# Patient Record
Sex: Female | Born: 1975 | Race: White | Hispanic: No | Marital: Married | Smoking: Former smoker
Health system: Southern US, Community
[De-identification: ages and names within clinical notes are randomized; demographics above are authoritative.]

## PROBLEM LIST (undated history)

## (undated) DIAGNOSIS — F419 Anxiety disorder, unspecified: Secondary | ICD-10-CM

## (undated) DIAGNOSIS — O24419 Gestational diabetes mellitus in pregnancy, unspecified control: Secondary | ICD-10-CM

## (undated) DIAGNOSIS — G43909 Migraine, unspecified, not intractable, without status migrainosus: Secondary | ICD-10-CM

## (undated) DIAGNOSIS — F1910 Other psychoactive substance abuse, uncomplicated: Secondary | ICD-10-CM

## (undated) DIAGNOSIS — L509 Urticaria, unspecified: Secondary | ICD-10-CM

## (undated) DIAGNOSIS — D649 Anemia, unspecified: Secondary | ICD-10-CM

## (undated) HISTORY — DX: Migraine, unspecified, not intractable, without status migrainosus: G43.909

## (undated) HISTORY — DX: Gestational diabetes mellitus in pregnancy, unspecified control: O24.419

## (undated) HISTORY — PX: COLONOSCOPY: SHX174

## (undated) HISTORY — PX: OTHER SURGICAL HISTORY: SHX169

## (undated) HISTORY — DX: Anemia, unspecified: D64.9

## (undated) HISTORY — DX: Other psychoactive substance abuse, uncomplicated: F19.10

## (undated) HISTORY — DX: Anxiety disorder, unspecified: F41.9

## (undated) HISTORY — DX: Urticaria, unspecified: L50.9

## (undated) HISTORY — PX: TONSILLECTOMY: SUR1361

---

## 2005-07-14 ENCOUNTER — Inpatient Hospital Stay (HOSPITAL_COMMUNITY): Admission: AD | Admit: 2005-07-14 | Discharge: 2005-07-17 | Payer: Self-pay | Admitting: Obstetrics and Gynecology

## 2005-07-14 ENCOUNTER — Encounter (INDEPENDENT_AMBULATORY_CARE_PROVIDER_SITE_OTHER): Payer: Self-pay | Admitting: *Deleted

## 2007-03-25 ENCOUNTER — Encounter: Admission: RE | Admit: 2007-03-25 | Discharge: 2007-03-25 | Payer: Self-pay | Admitting: Obstetrics & Gynecology

## 2007-04-03 ENCOUNTER — Ambulatory Visit (HOSPITAL_COMMUNITY): Admission: RE | Admit: 2007-04-03 | Discharge: 2007-04-03 | Payer: Self-pay | Admitting: Obstetrics & Gynecology

## 2007-05-29 ENCOUNTER — Inpatient Hospital Stay (HOSPITAL_COMMUNITY): Admission: AD | Admit: 2007-05-29 | Discharge: 2007-06-01 | Payer: Self-pay | Admitting: Obstetrics and Gynecology

## 2008-10-07 ENCOUNTER — Encounter: Payer: Self-pay | Admitting: Internal Medicine

## 2009-02-06 IMAGING — US US OB COMP +14 WK
1 series · 14 of 28 positions shown · non-contrast
Comparison: none

OBSTETRICAL ULTRASOUND:

 This ultrasound exam was performed in the [HOSPITAL] Ultrasound Department.  The OB US report was generated in the AS system, and faxed to the ordering physician.  This report is also available in [REDACTED] PACS.

[Series 1: us ob comp +14 wk · 0.26mm/px · 14 of 44 slices shown]
[im 2/44]
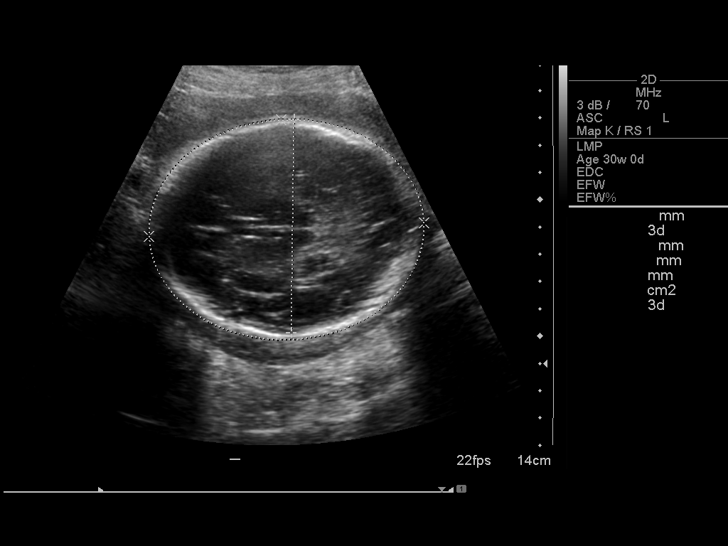
[im 5/44]
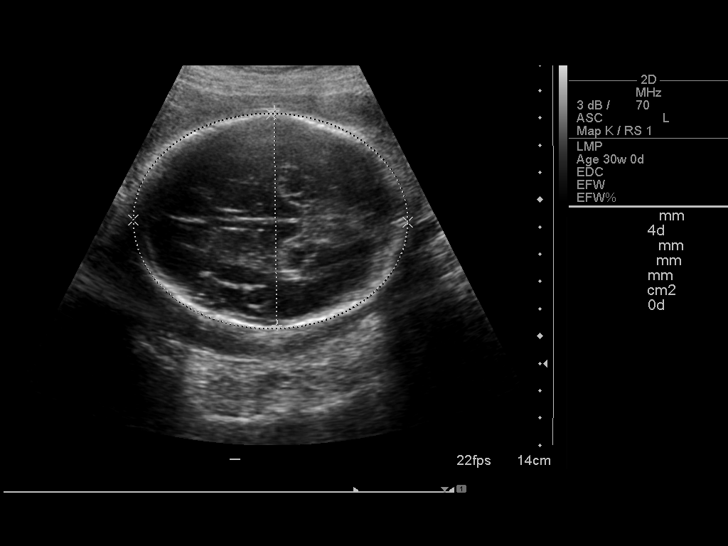
[im 8/44]
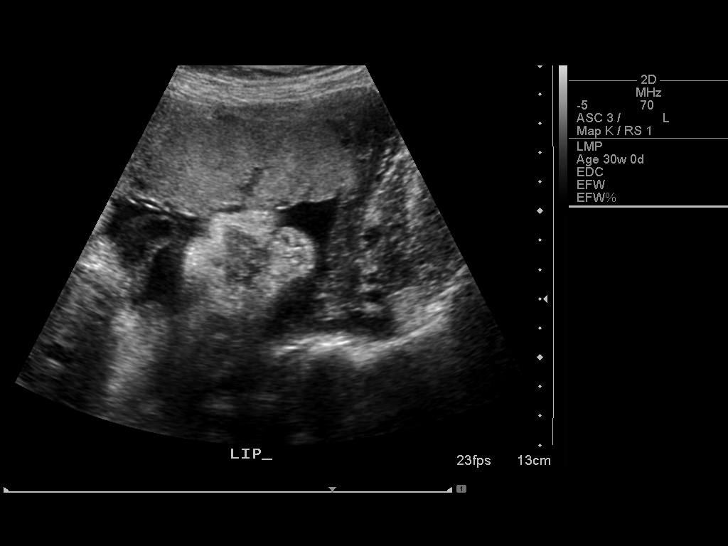
[im 12/44]
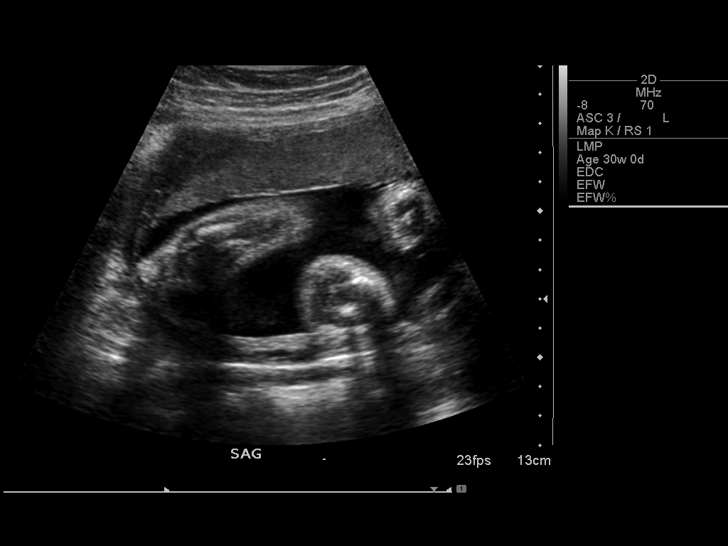
[im 15/44]
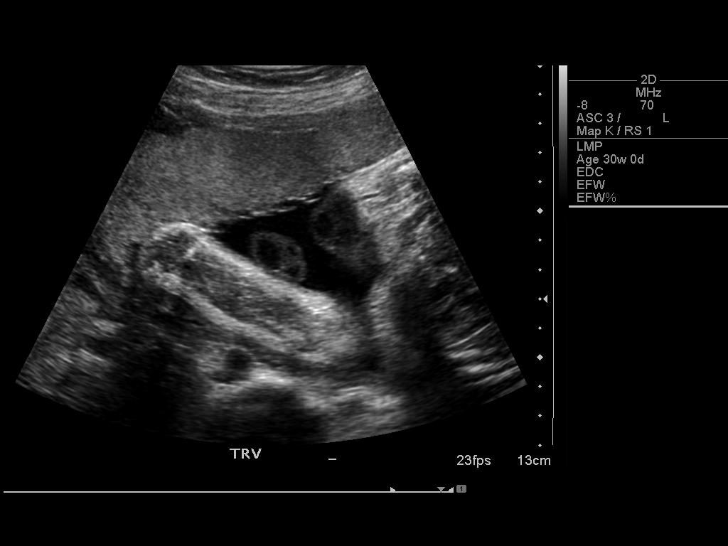
[im 18/44]
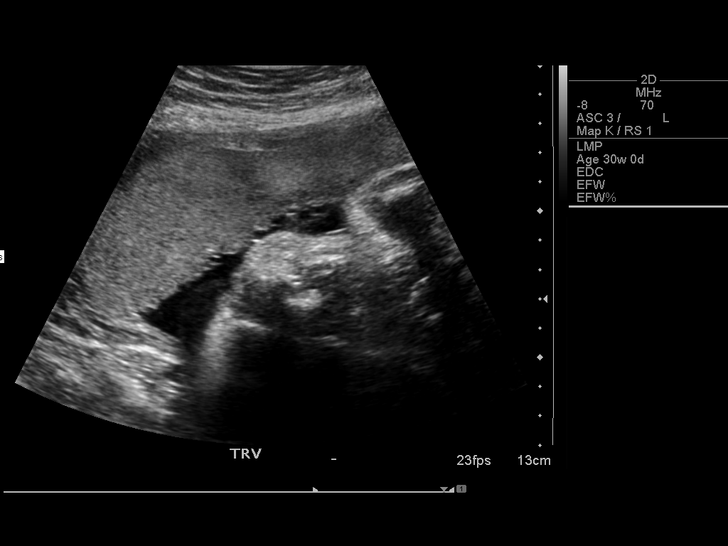
[im 21/44]
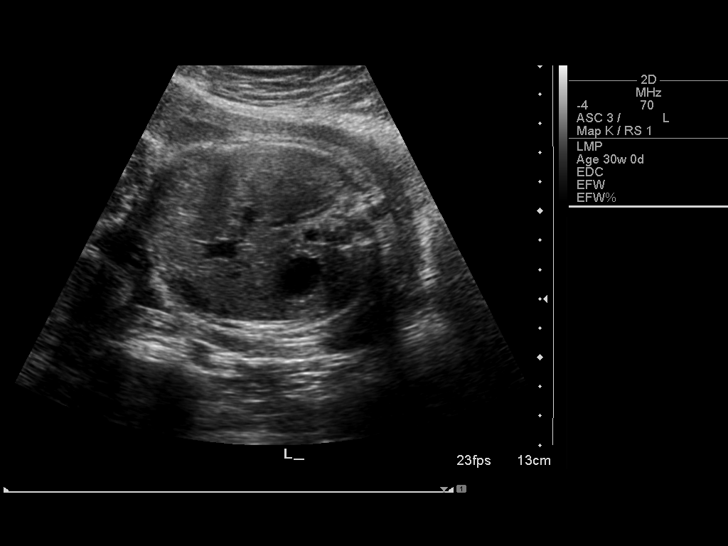
[im 24/44]
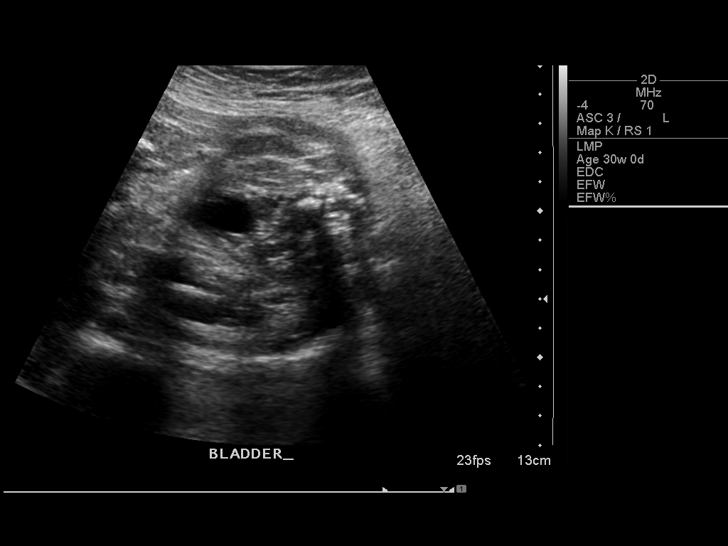
[im 28/44]
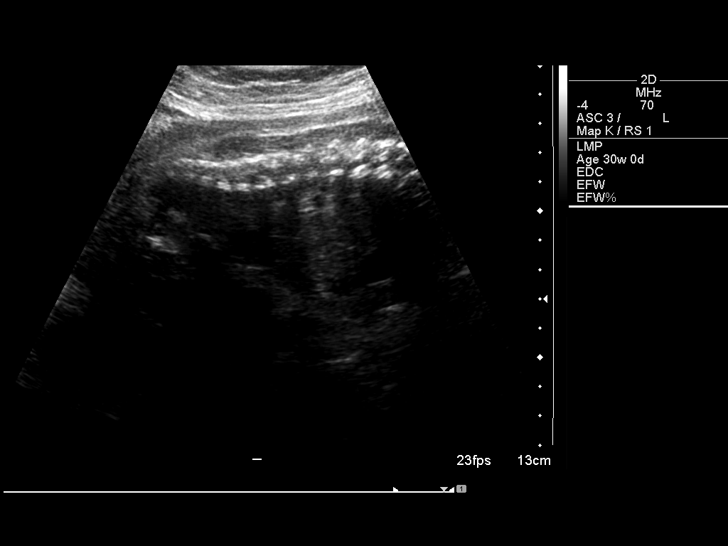
[im 31/44]
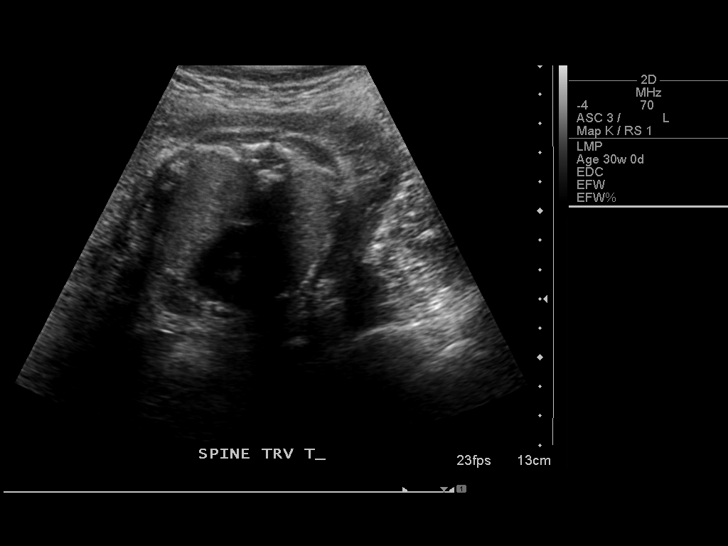
[im 34/44]
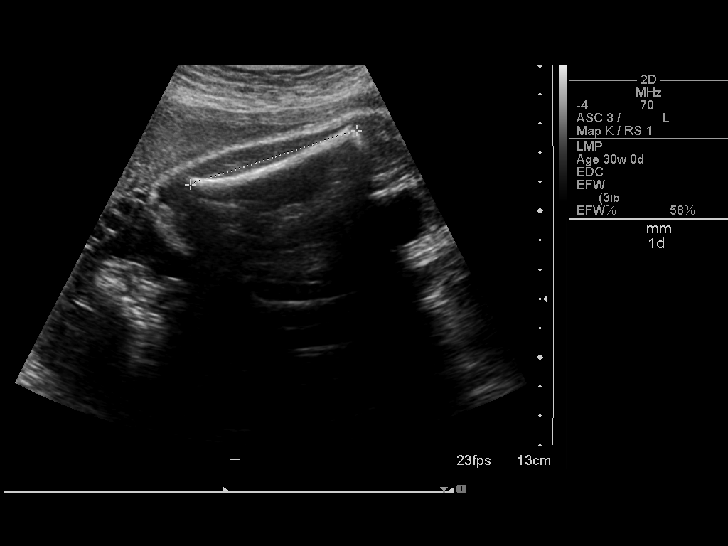
[im 37/44]
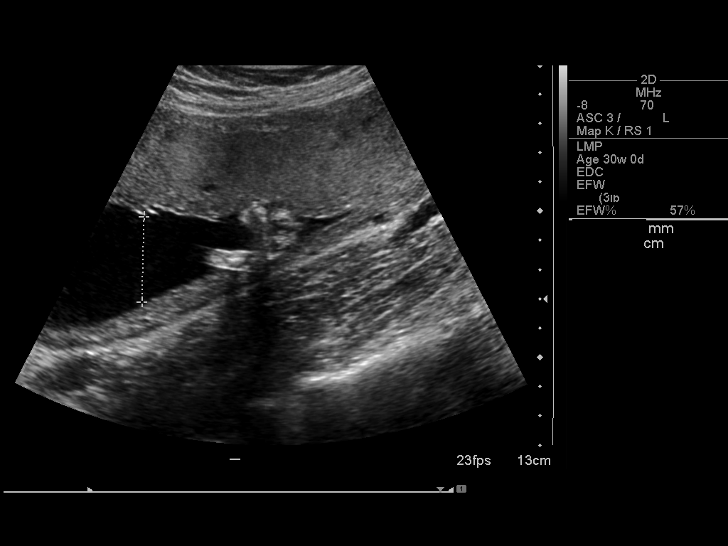
[im 40/44]
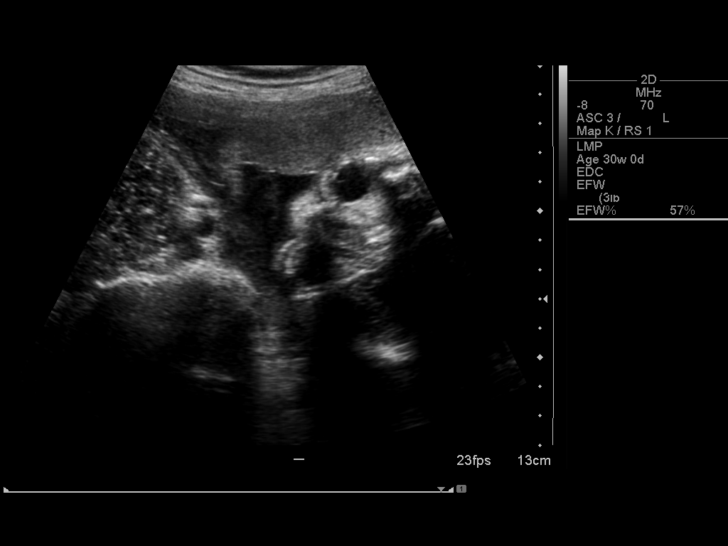
[im 44/44]
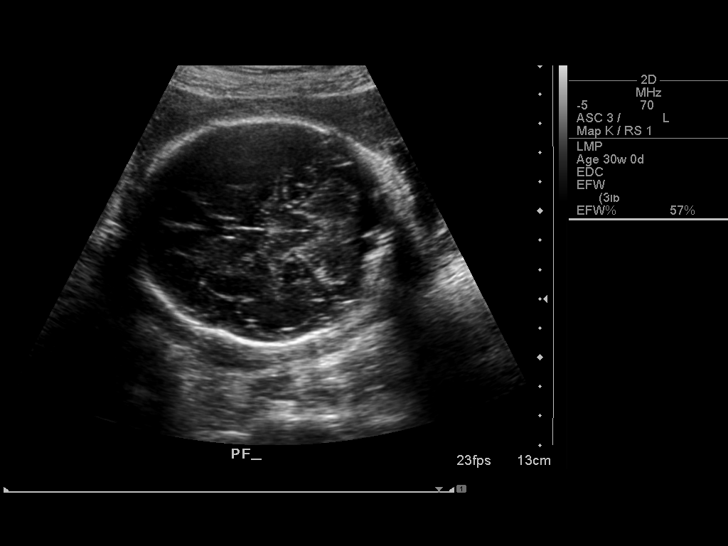

[14 of 28 positions shown; findings below may reference images not displayed]

IMPRESSION: See AS Obstetric US report.

## 2009-04-27 ENCOUNTER — Inpatient Hospital Stay (HOSPITAL_COMMUNITY): Admission: AD | Admit: 2009-04-27 | Discharge: 2009-04-29 | Payer: Self-pay | Admitting: Obstetrics

## 2009-11-21 ENCOUNTER — Encounter: Payer: Self-pay | Admitting: Internal Medicine

## 2010-05-08 ENCOUNTER — Ambulatory Visit: Payer: Self-pay | Admitting: Internal Medicine

## 2010-05-08 ENCOUNTER — Encounter: Payer: Self-pay | Admitting: Internal Medicine

## 2010-05-08 ENCOUNTER — Ambulatory Visit (HOSPITAL_COMMUNITY): Admission: RE | Admit: 2010-05-08 | Discharge: 2010-05-08 | Payer: Self-pay | Source: Home / Self Care

## 2010-05-08 ENCOUNTER — Ambulatory Visit: Payer: Self-pay

## 2010-05-08 ENCOUNTER — Ambulatory Visit: Payer: Self-pay | Admitting: Cardiology

## 2010-05-08 ENCOUNTER — Encounter (INDEPENDENT_AMBULATORY_CARE_PROVIDER_SITE_OTHER): Payer: Self-pay | Admitting: *Deleted

## 2010-05-08 DIAGNOSIS — G43909 Migraine, unspecified, not intractable, without status migrainosus: Secondary | ICD-10-CM | POA: Insufficient documentation

## 2010-05-08 DIAGNOSIS — I499 Cardiac arrhythmia, unspecified: Secondary | ICD-10-CM | POA: Insufficient documentation

## 2010-05-08 DIAGNOSIS — R599 Enlarged lymph nodes, unspecified: Secondary | ICD-10-CM | POA: Insufficient documentation

## 2010-05-08 DIAGNOSIS — Z8632 Personal history of gestational diabetes: Secondary | ICD-10-CM | POA: Insufficient documentation

## 2010-05-08 DIAGNOSIS — D649 Anemia, unspecified: Secondary | ICD-10-CM

## 2010-05-09 DIAGNOSIS — R0789 Other chest pain: Secondary | ICD-10-CM

## 2010-05-10 ENCOUNTER — Encounter: Payer: Self-pay | Admitting: Cardiology

## 2010-05-10 ENCOUNTER — Ambulatory Visit: Payer: Self-pay | Admitting: Cardiology

## 2010-05-10 DIAGNOSIS — R9431 Abnormal electrocardiogram [ECG] [EKG]: Secondary | ICD-10-CM | POA: Insufficient documentation

## 2010-05-10 DIAGNOSIS — R002 Palpitations: Secondary | ICD-10-CM | POA: Insufficient documentation

## 2010-05-16 ENCOUNTER — Encounter: Payer: Self-pay | Admitting: Internal Medicine

## 2010-06-09 ENCOUNTER — Encounter: Payer: Self-pay | Admitting: Internal Medicine

## 2010-06-09 LAB — CONVERTED CEMR LAB
BUN: 15 mg/dL (ref 6–23)
CO2: 26 meq/L (ref 19–32)
Cholesterol: 179 mg/dL (ref 0–200)
Creatinine, Ser: 0.76 mg/dL (ref 0.40–1.20)
Glucose, Bld: 93 mg/dL (ref 70–99)
HCT: 36.1 % (ref 36.0–46.0)
HDL: 74 mg/dL (ref >39)
MCV: 86.8 fL (ref 78.0–100.0)
RDW: 12.4 % (ref 11.5–15.5)
Sodium: 143 meq/L (ref 135–145)
TSH: 2.179 microintl units/mL (ref 0.350–4.500)
WBC: 4.4 10*3/uL (ref 4.0–10.5)

## 2010-06-13 ENCOUNTER — Ambulatory Visit
Admission: RE | Admit: 2010-06-13 | Discharge: 2010-06-13 | Payer: Self-pay | Source: Home / Self Care | Attending: Internal Medicine | Admitting: Internal Medicine

## 2010-06-13 DIAGNOSIS — M546 Pain in thoracic spine: Secondary | ICD-10-CM | POA: Insufficient documentation

## 2010-06-26 ENCOUNTER — Ambulatory Visit
Admission: RE | Admit: 2010-06-26 | Discharge: 2010-06-26 | Payer: Self-pay | Source: Home / Self Care | Attending: Psychiatry | Admitting: Psychiatry

## 2010-07-11 NOTE — Assessment & Plan Note (Signed)
Summary: [facility]   Visit Type:  Initial Consult Primary Provider:  Dondra Spry DO  CC:  follow up echo.  History of Present Illness: 35 year old female for evaluation of abnormal ECG and palpitations. No prior cardiac history. Patient seen recently for routine physical examination and PR interval felt to be short. Echocardiogram in November of 2011 revealed normal LV function and no significant valvular abnormality. There was concern about possible WPW based on short PR interval and cardiology was asked to further evaluate. The patient denies dyspnea on exertion, orthopnea, PND, pedal edema, history of syncope or chest pain. She rarely feels a brief "skip" but no sustained palpitations.  Current Medications (verified): 1)  Paragard Intrauterine Copper  Iud (Iud's) .... Placed in January 2010 2)  Multivitamins  Tabs (Multiple Vitamin) .... Sometimes  Allergies: 1)  ! Erythromycin  Past History:  Past Medical History: ANEMIA MIGRAINE HEADACHE DIABETES MELLITUS, GESTATIONAL She has a history of one uncomplicated TAB in 1995.   Past Surgical History: Primary uncomplicated C. section in 2007 Tonsillectomy  Family History: Reviewed history from 05/08/2010 and no changes required. Kidney stones, rheumatoid arthritis, skin cancer, nicotine dependence, hypertension.  No premature CAD in immediate family     Social History: Reviewed history from 05/08/2010 and no changes required. She is a nonsmoker (previous 10 pack year history).  Nondrinker.  She denies domestic or physical violence.  Married  3 Children  Review of Systems       no fevers or chills, productive cough, hemoptysis, dysphasia, odynophagia, melena, hematochezia, dysuria, hematuria, rash, seizure activity, orthopnea, PND, pedal edema, claudication. Remaining systems are negative.   Vital Signs:  Patient profile:   35 year old female Height:      65 inches Weight:      125.75 pounds BMI:      21.00 Pulse rate:   88 / minute Pulse rhythm:   regular Resp:     18 per minute BP sitting:   130 / 80  (left arm) Cuff size:   large  Vitals Entered By: Vikki Ports (May 10, 2010 11:09 AM)  Physical Exam  General:  Well developed/well nourished in NAD Skin warm/dry Patient not depressed No peripheral clubbing Back-normal HEENT-normal/normal eyelids Neck supple/normal carotid upstroke bilaterally; no bruits; no JVD; no thyromegaly chest - CTA/ normal expansion CV - RRR/normal S1 and S2; no murmurs, rubs or gallops;  PMI nondisplaced Abdomen -NT/ND, no HSM, no mass, + bowel sounds, no bruit 2+ femoral pulses, no bruits Ext-no edema, chords, 2+ DP Neuro-grossly nonfocal     Impression & Recommendations:  Problem # 1:  ABNORMAL ELECTROCARDIOGRAM (ICD-794.31) I reviewed the patient's electrocardiogram. The PR interval is 0.12. I think this is at the lower limits of normal. There is no delta wave. She does not have sustained palpitations or history of syncope. I therefore do not think she has WPW at this point. I do not think further workup or therapy is indicated.  Problem # 2:  PALPITATIONS (ICD-785.1) These sound to be most likely premature beats. They are self-limited and did not appear to be problematic. No further workup necessary.

## 2010-07-11 NOTE — Miscellaneous (Signed)
Summary: pap smear, tdap, flu  Clinical Lists Changes  Observations: Added new observation of TD BOOSTER: Historical (06/13/2009 13:31) Added new observation of FLU VAX: Historical (03/19/2009 13:31) Added new observation of PAP SMEAR: normal (10/07/2008 13:33)      Immunization History:  Tetanus/Td Immunization History:    Tetanus/Td:  historical (06/13/2009)  Influenza Immunization History:    Influenza:  historical (03/19/2009)    Preventive Care Screening  Last Tetanus Booster:    Date:  06/13/2009    Results:  Historical  Pap Smear:    Date:  10/07/2008    Results:  normal

## 2010-07-11 NOTE — Letter (Signed)
Summary: Outpatient Coinsurance Notice  Outpatient Coinsurance Notice   Imported By: Marylou Mccoy 05/10/2010 11:59:03  _____________________________________________________________________  External Attachment:    Type:   Image     Comment:   External Document

## 2010-07-11 NOTE — Miscellaneous (Signed)
Summary: Appointment Canceled  Appointment status changed to canceled by [employer] on 05/08/2010 11:01 AM.  Cancellation Comments --------------------- echo/short pr interv. poss WPW  Appointment Information ----------------------- Appt Type:  CARDIOLOGY ANCILLARY VISIT      Date:  Wednesday, May 10, 2010      Time:  2:00 PM for 60 min   Urgency:  Routine   Made By:  Hoy Finlay Scheduler  To Visit:  [id number]    Reason:  echo/short pr interv. poss WPW  Appt Comments ------------- -- 05/08/10 11:01: ([facility]) CANCELED -- echo/short pr interv. poss WPW -- 05/08/10 10:58: ([facility]) BOOKED -- Routine CARDIOLOGY ANCILLARY VISIT at 05/10/2010 2:00 PM for 60 min echo/short pr interv. poss WPW

## 2010-07-11 NOTE — Assessment & Plan Note (Signed)
Summary: NEW TO BE EST [employer] WANTS CPX/MHF   Vital Signs:  Patient profile:   35 year old female Menstrual status:  regular LMP:     04/13/2010 Height:      65 inches Weight:      124.25 pounds BMI:     20.75 O2 Sat:      100 % on Room air Temp:     98.8 degrees F oral Pulse rate:   99 / minute Resp:     18 per minute BP sitting:   110 / 60  (right arm) Cuff size:   regular  Vitals Entered By: Glendell Docker CMA (May 08, 2010 9:44 AM)  O2 Flow:  Room air CC: New Patient Is Patient Diabetic? No Pain Assessment Patient in pain? no      LMP (date): 04/13/2010     Menstrual Status regular Enter LMP: 04/13/2010 Last PAP Result normal   Primary Care Provider:  Dondra Spry DO  CC:  New Patient.  History of Present Illness: 35 y/o white female to establish previously seen by GYN for primary care  no significant medical hx besides migraines  2nd pregnancy - one episode of migraine with aura  3 headaches over last 6 months seen at headache center - Dr. Neale Burly gets nausea, light sensitivity  Preventive Screening-Counseling & Management  Alcohol-Tobacco     Alcohol drinks/day: 1     Smoking Status: quit     Packs/Day: 1.0     Year Started: 1994     Year Quit: 2004  Caffeine-Diet-Exercise     Caffeine use/day: 3 beverages daily     Does Patient Exercise: no  Allergies (verified): 1)  ! Erythromycin  Past History:  Past Medical History: Gestational diabetes with second pregnancy Migraine headaches - Dr. 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  The patient denies fever, weight loss, weight gain, chest pain, dyspnea on exertion, abdominal pain, and severe indigestion/heartburn.         no hx of syncope occ fluttering of chest pt also reports small lymph node right lower neck  Physical Exam  General:  alert, well-developed, and well-nourished.   Head:  normocephalic and atraumatic.   Eyes:  pupils equal, pupils round, and pupils reactive to light.   Ears:  R ear normal and L ear normal.   Mouth:  good dentition and pharynx pink and moist.   Neck:  supple, no masses, and no carotid bruits.    Lungs:  normal respiratory effort, normal breath sounds, no crackles, and no wheezes.   Heart:  normal rate, regular rhythm, and no gallop.   Abdomen:  soft, non-tender, normal bowel sounds, no hepatomegaly, and no splenomegaly.   Extremities:  No lower extremity edema  Neurologic:  cranial nerves II-XII intact.   Skin:  turgor normal and color normal.   Cervical Nodes:  tiny right lower neck LN (base of SCM muscle) Psych:  normally interactive, good eye contact, not anxious appearing, and not depressed appearing.     Impression &  Recommendations:  Problem # 1:  HEALTH MAINTENANCE EXAM (ICD-V70.0) Reviewed adult health maintenance protocols.  Orders: T-Basic Metabolic Panel [id number]) T-CBC No Diff ([id number]) T-Lipid Profile ([id number]) T-TSH [id number])  Pap smear: normal (09/28/2008) Td Booster: Tdap [facility]) (03/27/2005)     Problem # 2:  DIABETES MELLITUS, GESTATIONAL (ICD-648.80)  Orders: T- Hemoglobin A1C ([id number])  Problem # 3:  MIGRAINE HEADACHE (ICD-346.90) followed by Dr. Neale Burly no MRI of brain completed I agree MRI may not be necessary unless headache freq or character changes she was prescribed reglan to use when she experienced aura but never started due to apprehension about side effects I suggest 600 mg of ibuprofen with  first sign of headache  Problem # 4:  CARDIAC ARRHYTHMIA (ICD-427.9) pt with occ fluttering in her chest.   EKG shows shortened PR.  consider WPW obtain 2 D Echo arrange cardiology eval Orders: Echo Referral (Echo) Cardiology Referral (Cardiology)  Problem # 5:  CERVICAL LYMPHADENOPATHY (ICD-785.6) pt with tiny right lower cervical LN.  observe for now pt advised to reports any change in size  Complete Medication List: 1)  Paragard Intrauterine Copper Iud (Iud's) .... Placed in january 2010 2)  Multivitamins Tabs (Multiple vitamin) .... Sometimes  Patient Instructions: 1)  Please schedule a follow-up appointment in 3 months   Orders Added: 1)  T-Basic Metabolic Panel [[id number]] 2)  T-CBC No Diff [[id number]] 3)  T-Lipid Profile [[id number]] 4)  T-TSH [[id number]] 5)  T- Hemoglobin A1C [83036-23375] 6)  Echo Referral [Echo] 7)  Cardiology Referral [Cardiology] 8)  New Patient 18-39 years [99385] 81)  New Patient Level II [99202]   Immunization History:  Tetanus/Td Immunization History:    Tetanus/Td:  tdap (state) (03/27/2005)   Immunization History:  Tetanus/Td Immunization History:    Tetanus/Td:  Tdap ([place]) (03/27/2005)    Preventive Care Screening  Pap Smear:    Date:  09/28/2008    Results:  normal   Last Tetanus Booster:    Date:  03/27/2005    Results:  Tdap [facility])    Current Allergies (reviewed today): ! ERYTHROMYCIN

## 2010-07-13 NOTE — Letter (Signed)
[facility]   Imported By: Maryln Gottron 06/23/2010 11:09:37  _____________________________________________________________________  External Attachment:    Type:   Image     Comment:   External Document

## 2010-07-13 NOTE — Assessment & Plan Note (Signed)
Summary: back pain/mhf   Vital Signs:  Patient profile:   35 year old female Menstrual status:  regular Height:      65 inches Weight:      129.25 pounds BMI:     21.59 O2 Sat:      100 % on Room air Temp:     98.1 degrees F oral Pulse rate:   96 / minute Resp:     18 per minute BP sitting:   124 / 60  (right arm) Cuff size:   regular  Vitals Entered By: Glendell Docker CMA (June 13, 2010 10:06 AM)  O2 Flow:  Room air CC: Back Pain, Back pain Is Patient Diabetic? No Pain Assessment Patient in pain? yes     Location: upper back Intensity: 3 Type: aching/burning Onset of pain  Intermittent Comments discuss oily hair and skin, discuss echo cardio, review of labs,  mild mid to upper back pain for the past  month, location varies   Primary Care Provider:  DThomos Lemons DO  CC:  Back Pain and Back pain.  History of Present Illness:  Back Pain      This is a 35 year old woman who presents with Back pain.  The patient denies fever, chills, and weakness.  The pain is located in the right thoracic back and left thoracic back.  The pain began at home and gradually.  The pain is made better by inactivity.  pt freq lifing / carrying her children  reviewed cardiology consult  pt admits she may have chronic anxiety issue   Preventive Screening-Counseling & Management  Alcohol-Tobacco     Smoking Status: quit  Allergies: 1)  ! Erythromycin  Past History:  Past Medical History: ANEMIA MIGRAINE HEADACHE DIABETES MELLITUS, GESTATIONAL She has a history of one uncomplicated TAB in 1995.  Anxiety  Past Surgical History: Primary uncomplicated C. section in 2007 Tonsillectomy   Family History: Kidney stones, rheumatoid arthritis, skin cancer, nicotine dependence, hypertension.  No premature CAD in immediate family      Social History: She is a nonsmoker (previous 10 pack year history).  Nondrinker.  She denies domestic or physical violence.  Married  3  Children    Physical Exam  General:  alert, well-developed, and well-nourished.   Lungs:  normal respiratory effort, normal breath sounds, no crackles, and no wheezes.   Heart:  normal rate, regular rhythm, and no gallop.   Extremities:  No lower extremity edema    Impression & Recommendations:  Problem # 1:  BACK PAIN, THORACIC REGION (ICD-724.1) likely strain from lifing use muscle relaxer as needed we discussed proper lifting techniques Patient advised to call office if symptoms persist or worsen.  Her updated medication list for this problem includes:    Cyclobenzaprine Hcl 5 Mg Tabs (Cyclobenzaprine hcl) ..... One by mouth at bedtime as needed for upper back pain  Complete Medication List: 1)  Paragard Intrauterine Copper Iud (Iud's) .... Placed in january 2010 2)  Multivitamins Tabs (Multiple vitamin) .... Take 1 tablet by mouth once a day 3)  Cyclobenzaprine Hcl 5 Mg Tabs (Cyclobenzaprine hcl) .... One by mouth at bedtime as needed for upper back pain  Patient Instructions: 1)  Please schedule a follow-up appointment in 1 year. 2)  Please schedule a follow-up appointment as needed. Prescriptions: CYCLOBENZAPRINE HCL 5 MG TABS (CYCLOBENZAPRINE HCL) one by mouth at bedtime as needed for upper back pain  #30 x 1   Entered and Authorized by:  Dondra Spry DO   Signed by:   D. Thomos Lemons DO on 06/13/2010   Method used:   Electronically to        [facility]. [phone]* (retail)       [address]       [place], [place]  [address]       Ph: [phone]       Fax: [phone]   RxID:   [id number]    Orders Added: 1)  Est. Patient Level III [36644]    Current Allergies (reviewed today): ! ERYTHROMYCIN

## 2010-07-17 ENCOUNTER — Ambulatory Visit (INDEPENDENT_AMBULATORY_CARE_PROVIDER_SITE_OTHER): Payer: 59 | Admitting: Psychiatry

## 2010-07-17 DIAGNOSIS — F4323 Adjustment disorder with mixed anxiety and depressed mood: Secondary | ICD-10-CM

## 2010-09-13 LAB — CBC
HCT: 36.3 % (ref 36.0–46.0)
Hemoglobin: 12.4 g/dL (ref 12.0–15.0)
MCV: 92.1 fL (ref 78.0–100.0)
Platelets: 213 10*3/uL (ref 150–400)
RDW: 12.6 % (ref 11.5–15.5)
WBC: 14 10*3/uL — ABNORMAL HIGH (ref 4.0–10.5)

## 2010-09-13 LAB — RPR: RPR Ser Ql: NONREACTIVE

## 2010-10-24 NOTE — Discharge Summary (Signed)
Mary Berg, Mary Berg                 ACCOUNT NO.:  [id number]   MEDICAL RECORD NO.:  [id number]          PATIENT TYPE:  INP   LOCATION:  9171                          [facility]ILITY:  [facility]   PHYSICIAN:  Lenoard Aden, M.D.DATE OF BIRTH:  December 30, 1975   DATE OF ADMISSION:  05/29/2007  DATE OF DISCHARGE:                               DISCHARGE SUMMARY   ADMISSION DIAGNOSIS:  A 38 weeks of oligohydramnios for cervical  ripening, induction and attempted vaginal birth after cesarean.   HISTORY:  She is a 35 year old Caucasian female G3, P1, with history of  abortion x 1, history of primary C. section in 2006 for breech, who  presents with oligohydramnios for attempted VBAC.  She has a personal  history of gestational diabetes, diet controlled.  She is RH negative.  Prenatal course otherwise uncomplicated except for gestational diabetes.  GBS is negative.   ALLERGIES:  ERYTHROMYCIN.   SOCIAL HISTORY:  She is a nonsmoker.  Nondrinker.  She denies domestic  or physical violence.   FAMILY HISTORY:  Kidney stones, rheumatoid arthritis, skin cancer,  nicotine dependence, hypertension.   PAST MEDICAL HISTORY:  1. She has a history of one uncomplicated TAB in 1995.  2. Primary uncomplicated C. section in 2007.   PHYSICAL EXAMINATION:  GENERAL:  She is a well-developed, well-nourished  white female in no acute distress.  HEENT:  Normal.  LUNGS:  Clear.  HEART:  Regular rhythm.  ABDOMEN:  Soft, gravid, nontender.  Estimated fetal weight by ultrasound  6 pounds 13 ounces.  GU:  Cervix is 1-2 cm, 50% vertex, -1, -2.  EXTREMITIES:  Show no cords.  NEUROLOGIC:  Intact.   NST reveals fetal heart tones to be 160-170 beats per minute range with  a good beat variability.  Cervidil to be placed upon obtaining an IV.   IMPRESSION:  1. A 36 week OB.  2. Gestational diabetes, diet controlled.  3. Oligohydramnios.  4. Previous Cesarean section with attempted vaginal birth after      cesarean.   Small risk of uterine dehiscence with Pitocin and/or prostaglandin use  are discussed in the 1-3% range.  The patient acknowledges and wishes to  proceed.      Lenoard Aden, M.D.  Electronically Signed     RJT/MEDQ  D:  05/29/2007  T:  05/29/2007  Job:  [id number]   cc:   Lenoard Aden, M.D.  Fax: [phone]

## 2010-10-27 NOTE — Op Note (Signed)
Mary Berg, Mary Berg                 ACCOUNT NO.:  [id number]   MEDICAL RECORD NO.:  [id number]          PATIENT TYPE:  INP   LOCATION:  9320                          [facility]ACILITY:  [facility]   PHYSICIAN:  Richardean Sale, M.D.   DATE OF BIRTH:  [birth year]   DATE OF PROCEDURE:  07/14/2005  DATE OF DISCHARGE:                                 OPERATIVE REPORT   PREOPERATIVE DIAGNOSES:  1.  A 38+ week intrauterine pregnancy.  2.  Complete breech presentation.  3.  Spontaneous rupture of membranes.   POSTOPERATIVE DIAGNOSES:  1.  A 38+ week intrauterine pregnancy.  2.  Complete breech presentation.  3.  Spontaneous rupture of membranes.   PROCEDURE:  Primary low transverse cesarean section.   SURGEON:  Dr. Richardean Sale   ASSISTANT:  None.   ANESTHESIA:  Spinal.   COMPLICATIONS:  None.   ESTIMATED BLOOD LOSS:  500 mL.   URINE OUTPUT:  600 mL, clear.   OPERATIVE FINDINGS:  Viable female infant in the complete breech presentation  with Apgars of 9 and 9, intact placenta with three-vessel cord, arterial  cord pH 7.31 estimated birth weight 8 pounds 4 ounces.  Normal-appearing  uterus, normal uterine cavity, normal-appearing fallopian tubes and ovaries  with a small collapsed cyst on the left ovary, suspicious for a corpus  luteum or possibly an endometrioma, total size less than 2 cm.   INDICATIONS:  This is a 35 year old gravida 2, para 0-0-1-0 white female,  who is at 38+ weeks' gestation, who presented on the morning of February 3.  2007. complaining of spontaneous rupture membranes at 4:30 a.m.  On  evaluation, the patient was found to be grossly ruptured and contracting  irregularly.  Fetal heart rate tracing was reassuring.  The patient was  known to have a breech presentation, and ultrasound was performed at the  bedside which confirmed the pregnancy was still breech.  The patient was  therefore consented on recommendations for primary cesarean delivery.  The  risks, benefits, and  alternatives of the procedure were reviewed with the  patient in detail.  We discussed the risks which include but are not limited  to hemorrhage requiring transfusion; infection; injury to the bowel, the  bladder, the ureters or other organs which could require additional surgery  at the time of this procedure or in the future.  Reviewed risks of  anesthesia and related complications.  The patient was understanding of all  these risks and desired to proceed.  Informed consent was obtained before  proceeding to the OR.   PROCEDURE:  The patient was taken to the operating room where she was given  a spinal anesthetic.  She was then prepped and draped in the usual sterile  fashion with Betadine, and a Foley catheter was placed.  She was placed in  the supine position with a leftward tilt.  Then 10 mL of 0.25% plain  Marcaine were then used to inject the skin where the incision was to be  made, and a Pfannenstiel skin incision was then made with a scalpel.  This  was  carried down sharply to the fascia.  The fascia was then incised in the  midline.  The superior and inferior aspects of the fascia were then grasped  with Kocher clamps, elevated, and the underlying rectus muscles dissected  off with both sharp and blunt dissection.  The muscles were then separated  in the midline.  The peritoneum was then identified, grasped between 2  hemostats, and entered sharply.  This incision was then extended superiorly  and inferiorly with good visualization of the bladder.  The bladder blade  was then inserted, and the vesicouterine peritoneum was grasped with pickups  and entered sharply with Metzenbaum scissors.  This incision was then  extended laterally, and the bladder flap was created digitally.   The bladder blade was then reinserted and the lower uterine segment was  incised in a transverse fashion with a scalpel. This incision was then  extended manually and was then inserted into the  incision.  The infant was  confirmed as complete breech.  The breech was delivered by rotating the  sacrum anteriorly.  The knees and hips were delivered in a flexed position.  The arms were delivered by sweeping them across the chest, and the head was  delivered in a flexed position with the finger in the mouth.  After  delivery, the nose and mouth were suctioned with the bulb.  The cord was  clamped and cut, and the infant was handed off to the waiting NICU  attendants with a vigorous cry.  Cord pH and cord blood were sent.  Apgars  were 9 and 9.  At this point, the placenta was then removed; the uterus was  cleared of all clot debris, and the uterine incision was repaired with 1-0  chromic in a running locked fashion.  The second layer of the same suture  was then placed in an imbricating fashion for additional hemostasis and in  the event the patient would trial vaginal delivery in the future.  Once the  uterine incision was hemostatic, the uterus was reinspected.  The adnexa  were inspected, and a small less than 2 cm collapsed cyst was present on the  left ovary.  There was a very firm, pea-sized area present on this cyst.  This was grasped with a Babcock clamp and removed with the Bovie cautery and  was sent to pathology labeled as left ovarian biopsy.  The ovary was  hemostatic.  The uterine incision was reinspected and was hemostatic.  The  pelvis was then irrigated copiously with warm normal saline.  Any areas of  bleeding were cauterized with the Bovie.  The peritoneal surfaces, muscular  surfaces, and subfascial areas were all inspected, and any areas of bleeding  were cauterized with the Bovie.  The peritoneum was then closed with a  running 3-0 Vicryl suture, and the fascia was closed with a running Vicryl  suture.  Subcutaneous space was irrigated.  Any areas of bleeding were  cauterized with a Bovie, and the skin was closed with staples.  The patient tolerated this  procedure very well.  All sponge, lap, needle,  and instruments were correct x2.  The patient was taken to recovery room  awake and in stable condition, and there were no complications.      Richardean Sale, M.D.  Electronically Signed     JW/MEDQ  D:  07/14/2005  T:  07/14/2005  Job:  [id number]

## 2010-10-27 NOTE — Discharge Summary (Signed)
NAMEARIDAY, BRINKER                 ACCOUNT NO.:  [id number]   MEDICAL RECORD NO.:  [id number]          PATIENT TYPE:  INP   LOCATION:  9320                          FACILITY:  [facility]   PHYSICIAN:  Richardean Sale, M.D.   DATE OF BIRTH:  16-Dec-1975   DATE OF ADMISSION:  07/14/2005  DATE OF DISCHARGE:  07/17/2005                                 DISCHARGE SUMMARY   ADMITTING DIAGNOSES:  A 38+ week intrauterine pregnancy with spontaneous  rupture of membranes and breech presentation.   DISCHARGE DIAGNOSES:  1.  A 38+ week intrauterine pregnancy with spontaneous rupture of membranes      and breech presentation.  2.  Status post primary cesarean delivery.  3.  Anemia.   PROCEDURES:  Primary low-transverse cesarean section performed on July 14, 2005.   OPERATIVE FINDINGS:  Viable female infant with Apgars of 9 and 9, estimated  birth weight of 8 pounds 3 ounces and arterial cord pH 7.31.   HOSPITAL COURSE AND HISTORY OF PRESENT ILLNESS:  Please see the admission  history and physical for details.  Briefly, the patient is 35 year old white  female, who is at 38+ weeks gestation with a known breech presentation, who  presented on the morning of July 14, 2005 with spontaneous rupture of  membranes.  The patient subsequently underwent an uncomplicated primary  cesarean delivery resulting in delivery of a viable female infant.  Her  postoperative course was unremarkable.  The patient remained afebrile  throughout her hospitalization.  On postoperative day #3, she was ambulating  well, was voiding without difficulty, bowel function had returned and she  was tolerating a regular diet, and her pain was adequately controlled.  She  was subsequently discharged to home on postoperative day #3 in good  condition.   DISPOSITION:  To home.   CONDITION:  Good.   FOLLOW UP:  The patient will follow up in 4-6 weeks for a postoperative  visit at [facility].   INSTRUCTIONS:  She is to call  for fever greater than 100.4, any abdominal  pain not relieved with pain medication, heavy vaginal bleeding, redness or  drainage from her incision, or any other questions.   LABORATORY STUDIES:  Preoperative hemoglobin 11.2, hematocrit 33.1,  platelets 248 and white count 7.9.  Postoperative hemoglobin 8.6, hematocrit  25.3, platelet count 204 and white count 9.8.   ADDENDUM:  At the time of the patient's cesarean delivery, there was a small  fragment of tissue that suggested of endometriosis.  This was biopsied and  the final pathology report came back as luteinized stroma with associated  hemorrhage.  No malignancy or endometriosis identified.      Richardean Sale, M.D.  Electronically Signed     JW/MEDQ  D:  08/05/2005  T:  08/06/2005  Job:  [id number]

## 2011-01-18 ENCOUNTER — Encounter: Payer: Self-pay | Admitting: Cardiology

## 2011-03-16 LAB — CBC
HCT: 35.5 — ABNORMAL LOW
Hemoglobin: 12.4
MCV: 90.4
RBC: 3.93
RDW: 12.9

## 2011-06-20 ENCOUNTER — Ambulatory Visit (INDEPENDENT_AMBULATORY_CARE_PROVIDER_SITE_OTHER): Payer: 59 | Admitting: Internal Medicine

## 2011-06-20 ENCOUNTER — Encounter: Payer: Self-pay | Admitting: Internal Medicine

## 2011-06-20 DIAGNOSIS — N949 Unspecified condition associated with female genital organs and menstrual cycle: Secondary | ICD-10-CM

## 2011-06-20 DIAGNOSIS — G8929 Other chronic pain: Secondary | ICD-10-CM | POA: Insufficient documentation

## 2011-06-20 DIAGNOSIS — R109 Unspecified abdominal pain: Secondary | ICD-10-CM

## 2011-06-20 DIAGNOSIS — R102 Pelvic and perineal pain: Secondary | ICD-10-CM

## 2011-06-20 LAB — POCT URINALYSIS DIPSTICK
Glucose, UA: NEGATIVE
Ketones, UA: NEGATIVE
Leukocytes, UA: NEGATIVE
Nitrite, UA: NEGATIVE
Protein, UA: NEGATIVE
Spec Grav, UA: 1.015

## 2011-06-20 NOTE — Progress Notes (Signed)
  Subjective:    Patient ID: Mary Berg, female    DOB: 22-Oct-1975, 36 y.o.   MRN: [id number]  HPI  36 year old white female complains of chronic intermittent left lower quadrant/left pelvic pain. She first noticed her symptoms during her third pregnancy. She describes left-sided dull ache. Her symptoms occasionally localized to the left flank. Her symptoms are not related to her menstrual cycles or urination. In she reports her symptoms somethimes relieved by passing gas. During her second and third trimester of her third pregnancy her symptoms completely went away until approximately 6 months ago. She was seen by her GYN in April 2012. There is no mention of abnormal pelvic exam.  Review of Systems Negative for change in bowel habits, no dysuria, no urinary frequency.  Negative for melena or hematochezia  Past Medical History  Diagnosis Date  . Anemia   . Migraine headache   . DM (diabetes mellitus), gestational   . Anxiety     History   Social History  . Marital Status: Married    Spouse Name: N/A    Number of Children: 3  . Years of Education: N/A   Occupational History  . Not on file.   Social History Main Topics  . Smoking status: Former Smoker -- 1.0 packs/day for 10 years    Types: Cigarettes  . Smokeless tobacco: Not on file  . Alcohol Use: No  . Drug Use: Not on file  . Sexually Active: Not on file   Other Topics Concern  . Not on file   Social History Narrative  . No narrative on file    Past Surgical History  Procedure Date  . Tab     history of one uncomplicated TAB in 1995  . Cesarean section     primary uncomplicated in 2007  . Tonsillectomy     Family History  Problem Relation Age of Onset  . Nephrolithiasis    . Arthritis    . Skin cancer    . Hypertension    . Coronary artery disease      no premature in immediate family    Allergies  Allergen Reactions  . Erythromycin     REACTION: sensitive stomach    Current Outpatient  Prescriptions on File Prior to Visit  Medication Sig Dispense Refill  . Multiple Vitamin (MULTIVITAMIN) tablet Take 1 tablet by mouth daily.          BP 102/68  Temp(Src) 99.6 F (37.6 C) (Oral)  Wt 131 lb (59.421 kg)      Objective:   Physical Exam  Constitutional: She is oriented to person, place, and time. She appears well-developed and well-nourished.  Cardiovascular: Normal rate, regular rhythm and normal heart sounds.   Pulmonary/Chest: Effort normal and breath sounds normal. No respiratory distress. She has no wheezes.  Abdominal: Soft. Bowel sounds are normal.       Mild left pelvic fullness  Genitourinary: Guaiac negative stool.  Neurological: She is alert and oriented to person, place, and time.  Skin: Skin is warm and dry.  Psychiatric: She has a normal mood and affect. Her behavior is normal.          Assessment & Plan:

## 2011-06-20 NOTE — Patient Instructions (Signed)
Our office will contact you re:  Ultrasound results You can try taking over the counter Gas x or Beano

## 2011-06-20 NOTE — Assessment & Plan Note (Signed)
A 36 year old white female with intermittent left pelvic/left lower quadrant pain. I suspect her symptoms are IBS in nature. Her rectal exam was normal. Obtain pelvic ultrasound considering mild fullness of left pelvis.  Patient reports eating high fiber diet.   Patient will try over the counter gas x or beano. Patient advised to call office if symptoms persist or worsen.

## 2011-06-21 ENCOUNTER — Other Ambulatory Visit: Payer: Self-pay | Admitting: Internal Medicine

## 2011-06-27 ENCOUNTER — Other Ambulatory Visit: Payer: Self-pay | Admitting: Internal Medicine

## 2011-06-27 ENCOUNTER — Ambulatory Visit
Admission: RE | Admit: 2011-06-27 | Discharge: 2011-06-27 | Disposition: A | Discharge status: Home / Self Care | Payer: 59 | Source: Ambulatory Visit | Attending: Internal Medicine | Admitting: Internal Medicine

## 2011-06-27 DIAGNOSIS — R102 Pelvic and perineal pain: Secondary | ICD-10-CM

## 2011-06-28 ENCOUNTER — Telehealth: Payer: Self-pay | Admitting: Internal Medicine

## 2011-06-28 NOTE — Telephone Encounter (Signed)
Pt would like abd ultrasound

## 2011-06-29 NOTE — Telephone Encounter (Signed)
Pt aware of results 

## 2011-07-18 ENCOUNTER — Encounter: Payer: Self-pay | Admitting: Internal Medicine

## 2011-07-18 ENCOUNTER — Ambulatory Visit (INDEPENDENT_AMBULATORY_CARE_PROVIDER_SITE_OTHER): Payer: 59 | Admitting: Internal Medicine

## 2011-07-18 VITALS — BP 102/70 | Temp 99.0°F | Wt 132.0 lb

## 2011-07-18 DIAGNOSIS — G8929 Other chronic pain: Secondary | ICD-10-CM

## 2011-07-18 DIAGNOSIS — R1032 Left lower quadrant pain: Secondary | ICD-10-CM

## 2011-07-18 NOTE — Assessment & Plan Note (Signed)
36 year old white female with chronic left lower quadrant abdominal pain. Transvaginal ultrasound was negative for ovarian abnormality. I do not think her uterine fibroid is related to her symptomatology. Considering her sense of occasional incomplete evacuation, refer to GI for further evaluation.

## 2011-07-18 NOTE — Progress Notes (Signed)
  Subjective:    Patient ID: Mary Berg, female    DOB: 1976-03-10, 36 y.o.   MRN: [id number]  HPI  36 year old white female for followup regarding intermittent left lower quadrant pain. I reviewed her pelvic ultrasound. It was negative for ovarian abnormalities but did show a 2.1 cm uterine fibroid. Patient reports that her symptoms do improve when she decreases her intake of fibrous foods. However patient has noticed that she has sensation of incomplete evacuation and has bowel movements more frequently. She denies any family history of colon cancer.   Review of Systems Negative for melanoma or hematochezia Past Medical History  Diagnosis Date  . Anemia   . Migraine headache   . DM (diabetes mellitus), gestational   . Anxiety     History   Social History  . Marital Status: Married    Spouse Name: N/A    Number of Children: 3  . Years of Education: N/A   Occupational History  . Not on file.   Social History Main Topics  . Smoking status: Former Smoker -- 1.0 packs/day for 10 years    Types: Cigarettes  . Smokeless tobacco: Not on file  . Alcohol Use: No  . Drug Use: Not on file  . Sexually Active: Not on file   Other Topics Concern  . Not on file   Social History Narrative  . No narrative on file    Past Surgical History  Procedure Date  . Tab     history of one uncomplicated TAB in 1995  . Cesarean section     primary uncomplicated in 2007  . Tonsillectomy     Family History  Problem Relation Age of Onset  . Nephrolithiasis    . Arthritis    . Skin cancer    . Hypertension    . Coronary artery disease      no premature in immediate family    Allergies  Allergen Reactions  . Erythromycin     REACTION: sensitive stomach    No current outpatient prescriptions on file prior to visit.    BP 102/70  Temp(Src) 99 F (37.2 C) (Oral)  Wt 132 lb (59.875 kg)       Objective:   Physical Exam  Constitutional: She appears well-developed and  well-nourished.  Cardiovascular: Normal rate, regular rhythm and normal heart sounds.   Pulmonary/Chest: Effort normal and breath sounds normal.  Abdominal: Soft.       Mild LLQ tenderness and fullness          Assessment & Plan:

## 2011-07-18 NOTE — Patient Instructions (Signed)
Our office will contact you re:  GI referral

## 2011-07-25 ENCOUNTER — Encounter: Payer: Self-pay | Admitting: Internal Medicine

## 2011-08-01 ENCOUNTER — Ambulatory Visit (INDEPENDENT_AMBULATORY_CARE_PROVIDER_SITE_OTHER): Payer: 59 | Admitting: Family

## 2011-08-01 ENCOUNTER — Encounter: Payer: Self-pay | Admitting: Family

## 2011-08-01 VITALS — BP 120/82 | HR 95 | Ht 65.0 in | Wt 130.0 lb

## 2011-08-01 DIAGNOSIS — F410 Panic disorder [episodic paroxysmal anxiety] without agoraphobia: Secondary | ICD-10-CM

## 2011-08-01 DIAGNOSIS — F419 Anxiety disorder, unspecified: Secondary | ICD-10-CM

## 2011-08-01 DIAGNOSIS — F411 Generalized anxiety disorder: Secondary | ICD-10-CM

## 2011-08-01 MED ORDER — DIAZEPAM 5 MG PO TABS: 5.0000 mg | ORAL_TABLET | Freq: Two times a day (BID) | ORAL | Status: AC | PRN

## 2011-08-01 NOTE — Progress Notes (Signed)
Subjective:    Patient ID: Mary Berg, female    DOB: 1975/10/03, 36 y.o.   MRN: [id number]  HPI He 36 year old white female, nonsmoker, patient of Dr. Artist Pais. with him today with complaints of anxiety and panic attacks. She saw Dr. Artist Pais. last week who referred her to a gastroenterologist for evaluation of left lower quadrant abdominal pain. Patient reports she's been on research on the Internet and is convinced herself that she has colon cancer. The patient believes that she may have seen blood in her stool but she is not sure. She also has history of hemorrhoids and constipation. She has an appointment on Tuesday with GI. She's had a difficult time controlling her anxiety, very tearful and worried. She denies any symptoms of helplessness or hopelessness. Patient is worried that she has chronic. Denies any thoughts of suicide.     Review of Systems  Constitutional: Positive for appetite change.       Decreased appetite  Respiratory: Negative.   Cardiovascular: Negative.   Gastrointestinal: Positive for abdominal pain.       Left lower abdominal pain  Genitourinary: Negative.   Musculoskeletal: Negative.   Skin: Negative.   Hematological: Negative.   Psychiatric/Behavioral: The patient is nervous/anxious.    Past Medical History  Diagnosis Date  . Anemia   . Migraine headache   . DM (diabetes mellitus), gestational   . Anxiety     History   Social History  . Marital Status: Married    Spouse Name: N/A    Number of Children: 3  . Years of Education: N/A   Occupational History  . Not on file.   Social History Main Topics  . Smoking status: Former Smoker -- 1.0 packs/day for 10 years    Types: Cigarettes  . Smokeless tobacco: Not on file  . Alcohol Use: No  . Drug Use: Not on file  . Sexually Active: Not on file   Other Topics Concern  . Not on file   Social History Narrative  . No narrative on file    Past Surgical History  Procedure Date  . Tab     history of  one uncomplicated TAB in 1995  . Cesarean section     primary uncomplicated in 2007  . Tonsillectomy     Family History  Problem Relation Age of Onset  . Nephrolithiasis    . Arthritis    . Skin cancer    . Hypertension    . Coronary artery disease      no premature in immediate family    Allergies  Allergen Reactions  . Erythromycin     REACTION: sensitive stomach    Current Outpatient Prescriptions on File Prior to Visit  Medication Sig Dispense Refill  . vitamin C (ASCORBIC ACID) 500 MG tablet Take 500 mg by mouth daily.        BP 120/82  Pulse 95  Ht 5\' 5"  (1.651 m)  Wt 130 lb (58.968 kg)  BMI 21.63 kg/m2  SpO2 98%chart    Objective:   Physical Exam  Constitutional: She is oriented to person, place, and time. She appears well-developed and well-nourished.  Neck: Normal range of motion. Neck supple.  Cardiovascular: Normal rate, regular rhythm and normal heart sounds.   Pulmonary/Chest: Effort normal and breath sounds normal.  Abdominal: Soft. Bowel sounds are normal. She exhibits no distension. There is no tenderness. There is no rebound and no guarding.  Neurological: She is alert and oriented to person, place, and  time.  Skin: Skin is warm and dry.  Psychiatric:       Anxious, tearful          Assessment & Plan:  Assessment: Anxiety, panic attack  Plan: Start Valium 5 mg tablet twice daily when necessary anxiousness. If her anxiety and worry continue, we will consider SSRI. Information given for psychiatry and psychotherapy. Followup with her PCP pending her appointment with GI and some when necessary.

## 2011-08-01 NOTE — Patient Instructions (Signed)

## 2011-08-06 ENCOUNTER — Encounter: Payer: Self-pay | Admitting: Internal Medicine

## 2011-08-07 ENCOUNTER — Encounter: Payer: Self-pay | Admitting: Internal Medicine

## 2011-08-07 ENCOUNTER — Ambulatory Visit (INDEPENDENT_AMBULATORY_CARE_PROVIDER_SITE_OTHER): Payer: 59 | Admitting: Internal Medicine

## 2011-08-07 DIAGNOSIS — R198 Other specified symptoms and signs involving the digestive system and abdomen: Secondary | ICD-10-CM

## 2011-08-07 DIAGNOSIS — K625 Hemorrhage of anus and rectum: Secondary | ICD-10-CM

## 2011-08-07 DIAGNOSIS — R194 Change in bowel habit: Secondary | ICD-10-CM

## 2011-08-07 DIAGNOSIS — R1032 Left lower quadrant pain: Secondary | ICD-10-CM

## 2011-08-07 MED ORDER — PEG-KCL-NACL-NASULF-NA ASC-C 100 G PO SOLR: 1.0000 | Freq: Once | ORAL | Status: DC

## 2011-08-07 NOTE — Patient Instructions (Signed)
You have been scheduled for a colonoscopy with propofol. Please follow written instructions given to you at your visit today.  Please pick up your prep kit at the pharmacy within the next 1-3 days.  We have sent the following medications to your pharmacy for you to pick up at your convenience: Moviprep, please follow instructions given to you today at your visit.

## 2011-08-07 NOTE — Progress Notes (Signed)
Subjective:    Patient ID: Mary Berg, female    DOB: 11-14-1975, 36 y.o.   MRN: [id number]  HPI Mary Berg is a 36 yo female with PMH of anxiety who is seen in consultation at the request of Dr. Artist Pais for evaluation of LLQ pain and rectal bleeding.  The patient states first that she has had extreme issues with anxiety and worrying about the fact that she may have colon cancer. She also reports that she has a history of being a hypochondriac, and has counted the times in her life that she has felt that she's had cancer and this numbers about 10.  She reports right now she is very worried that she has colorectal cancer. She reports her GI issues date back to 2010 during her last pregnancy when she developed a 20 left lower quadrant pain. She attributed this to gas, as it often felt better with passing gas. This was intermittent and eventually resolved and never returned until May of 2012. At that point she was noticing again left lower corner pain associated with gas and being relieved by gas and lying on her stomach. She reports this is a very mild and intermittent pain which does not stop her daily functioning. She reports her bowel movements have been regular her whole life, and for her this means 1, formed, brown stool per day. However over the last 1-2 months she has noticed some loose stools occurring 2-3 times daily. She does endorse tenesmus over the last 2 weeks, and on one occasion, specifically 07/26/2011, she saw bright red blood on the toilet tissue and in the toilet water. This rectal bleeding was associated with anal pain and also with straining. She reports no nausea or vomiting. Her appetite has been normal and her weight is normal. She denies fatigue. She denies pelvic pain and reports normal menses, but notes them to be heavy, particularly after her IUD was placed. She did have a recent pelvic ultrasound which revealed a small uterine fibroid but otherwise was unremarkable. Of note, she was  recently given a prescription for diazepam which she is taking twice a day and this has helped with her anxiety. No fevers or chills. No joint pains or rashes.  Review of Systems As per history of present illness, otherwise unremarkable, except for anxiety, back pain, night sweats  Past Medical History  Diagnosis Date  . Anemia   . Migraine headache   . DM (diabetes mellitus), gestational   . Anxiety    Past Surgical History  Procedure Date  . Tab     history of one uncomplicated TAB in 1995  . Cesarean section     primary uncomplicated in 2007  . Tonsillectomy    Current Outpatient Prescriptions  Medication Sig Dispense Refill  . diazepam (VALIUM) 5 MG tablet Take 1 tablet (5 mg total) by mouth every 12 (twelve) hours as needed for anxiety.  30 tablet  1  . Ibuprofen (MOTRIN IB PO) Take by mouth as needed.      . vitamin C (ASCORBIC ACID) 500 MG tablet Take 500 mg by mouth daily.      . peg 3350 powder (MOVIPREP) 100 G SOLR Take 1 kit (100 g total) by mouth once.  1 kit  0   Allergies  Allergen Reactions  . Erythromycin     REACTION: sensitive stomach   Family History  Problem Relation Age of Onset  . Nephrolithiasis    . Arthritis    . Skin cancer    .  Hypertension    . Coronary artery disease      no premature in immediate family  . Colon cancer Neg Hx   -Mother with colon polyps after 50 (? Type)  History  Substance Use Topics  . Smoking status: Former Smoker -- 1.0 packs/day for 10 years    Types: Cigarettes  . Smokeless tobacco: Never Used  . Alcohol Use: Yes     10 drinks a week       Objective:   Physical Exam BP 120/68  Pulse 111  Ht 5\' 5"  (1.651 m)  Wt 135 lb (61.236 kg)  BMI 22.47 kg/m2 Constitutional: Well-developed and well-nourished. No distress. HEENT: Normocephalic and atraumatic. Oropharynx is clear and moist. No oropharyngeal exudate. Conjunctivae are normal. Pupils are equal round and reactive to light. No scleral icterus. Neck: Neck  supple. Trachea midline. Cardiovascular: Normal rate, regular rhythm and intact distal pulses. No M/R/G Pulmonary/chest: Effort normal and breath sounds normal. No wheezing, rales or rhonchi. Abdominal: Soft, nontender, nondistended. Bowel sounds active throughout. There are no masses palpable. No hepatosplenomegaly. Extremities: no clubbing, cyanosis, or edema Lymphadenopathy: No cervical adenopathy noted. Neurological: Alert and oriented to person place and time. Skin: Skin is warm and dry. Dry cracked skin noted bilateral hands, no visible rashes Psychiatric: Normal mood and affect. Behavior is normal.  CBC    Component Value Date/Time   WBC 4.4 06/09/2010 1431   RBC 4.16 06/09/2010 1431   HGB 12.1 06/09/2010 1431   HCT 36.1 06/09/2010 1431   PLT 246 06/09/2010 1431   MCV 86.8 06/09/2010 1431   MCHC 33.5 06/09/2010 1431   RDW 12.4 06/09/2010 1431   Clinical Data: Pain on left; IUD   TRANSABDOMINAL AND TRANSVAGINAL ULTRASOUND OF PELVIS --  Jan 2013  Technique:  Both transabdominal and transvaginal ultrasound examinations of the pelvis were performed. Transabdominal technique was performed for global imaging of the pelvis including uterus, ovaries, adnexal regions, and pelvic cul-de-sac.   Comparison: None.    It was necessary to proceed with endovaginal exam following the transabdominal exam to visualize the adnexa.   Findings:   The uterus is normal in size and echotexture, measuring 8.6 x 4.8 x 5.2 cm.  The endometrial stripe is thin and homogeneous, measuring 9 mm in width.  The IUD is in the proper location within the endometrium at the fundus.  There is a 2.1 cm fibroid at the anterior lower uterine segment.   Both ovaries have a normal size and appearance.  The right ovary measures 3.7 x 2.4 x 2.6 cm, and the left ovary measures 3.7 x 2.3 x 3.0 cm.  There are no adnexal masses or free pelvic fluid.   IMPRESSION: There is a 2.1 cm myometrial fibroid at the  anterior lower uterine segment.   The IUD is in the proper location.   Normal ovaries.     Assessment & Plan:  36 yo female with PMH of anxiety who is seen in consultation at the request of Dr. Artist Pais for evaluation of LLQ pain and rectal bleeding.  1. LLQ pain/change in bowel habits/isolated rectal bleeding -- given the patient's intermittent left lower quadrant pain, rubs or rectal bleeding, and her change in bowel habits, along with her significant anxiety regarding the issue, I recommended colonoscopy for visualization of her colon. This will allow [place] to exclude inflammation, polyps, masses, etc. It is possible this is all related to irritable bowel syndrome, but the colonoscopy is indicated given her complaints. We discussed this  test today in detail including the risks, and she is agreeable to proceed. Given her anxiety I like for this to be done with propofol. Further recommendations to be made after procedure.

## 2011-08-08 ENCOUNTER — Ambulatory Visit (AMBULATORY_SURGERY_CENTER): Payer: 59 | Admitting: Internal Medicine

## 2011-08-08 ENCOUNTER — Encounter: Payer: Self-pay | Admitting: Internal Medicine

## 2011-08-08 DIAGNOSIS — K625 Hemorrhage of anus and rectum: Secondary | ICD-10-CM

## 2011-08-08 DIAGNOSIS — D128 Benign neoplasm of rectum: Secondary | ICD-10-CM

## 2011-08-08 DIAGNOSIS — D126 Benign neoplasm of colon, unspecified: Secondary | ICD-10-CM

## 2011-08-08 DIAGNOSIS — D129 Benign neoplasm of anus and anal canal: Secondary | ICD-10-CM

## 2011-08-08 DIAGNOSIS — R1032 Left lower quadrant pain: Secondary | ICD-10-CM

## 2011-08-08 MED ORDER — SODIUM CHLORIDE 0.9 % IV SOLN: 500.0000 mL | INTRAVENOUS | Status: DC

## 2011-08-08 NOTE — Op Note (Signed)
[facility] [address] [place], [place]  [address]  COLONOSCOPY PROCEDURE REPORT  PATIENT:  Mary Berg, Mary Berg  MR#:  [id number] BIRTHDATE:  02-Sep-1975, 35 yrs. old  GENDER:  female ENDOSCOPIST:  Carie Caddy. Chandell Attridge, MD REF. BY:  Thomos Lemons, DO PROCEDURE DATE:  08/08/2011 PROCEDURE:  Colonoscopy with snare polypectomy ASA CLASS:  Class I INDICATIONS:  Abdominal pain, rectal bleeding, 1st colonoscopy MEDICATIONS:   MAC sedation, administered by CRNA, propofol (Diprivan) 300 mg IV  DESCRIPTION OF PROCEDURE:   After the risks benefits and alternatives of the procedure were thoroughly explained, informed consent was obtained.  Digital rectal exam was performed and revealed no rectal masses.   The LB [id number]-[id number] endoscope was introduced through the anus and advanced to the cecum, which was identified by both the appendix and ileocecal valve, without limitations.  The quality of the prep was good, using MoviPrep. The instrument was then slowly withdrawn as the colon was fully examined. <<PROCEDUREIMAGES>>  FINDINGS:  A 5 mm sessile polyp was found in the rectum. Polyp was snared, then cauterized with monopolar cautery. Retrieval was successful.  Mild diverticulosis was found in the sigmoid colon. Small internal hemorrhoids were found.   Retroflexed views in the rectum revealed hypertrophied anal papillae and a skin tag.   The scope was then withdrawn from the cecum and the procedure completed.  COMPLICATIONS:  None  ENDOSCOPIC IMPRESSION: 1) Sessile polyp in the rectum. Removed and sent to pathology. 2) Mild diverticulosis in the sigmoid colon 3) Small internal hemorrhoids 4) Hypertrophied anal papillae with skin tag likely as result of hemorrhoidal disease.  RECOMMENDATIONS: 1) Hold aspirin, aspirin products, and anti-inflammatory medication for 1 week. 2) Await pathology results 3) High fiber diet. 4) If the polyp removed today is proven to be an  adenomatous (pre-cancerous) polyp, you will need a repeat colonoscopy in 5 years. Otherwise you should continue to follow colorectal cancer screening guidelines for "routine risk" patients with colonoscopy beginning at age 31.  You will receive a letter within 1-2 weeks with the results of your biopsy as well as final recommendations. Please call my office if you have not received a letter after 3 weeks.  Carie Caddy. Rhea Belton, MD  CC:  Thomos Lemons, DO The Patient  n. eSIGNED:   Carie Caddy. Blase Beckner at 08/08/2011 09:59 AM  [place], [phone]

## 2011-08-08 NOTE — Progress Notes (Signed)
Patient did not have preoperative order for IV antibiotic SSI prophylaxis. (G8918)  Patient did not experience any of the following events: a burn prior to discharge; a fall within the facility; wrong site/side/patient/procedure/implant event; or a hospital transfer or hospital admission upon discharge from the facility. (G8907)  

## 2011-08-08 NOTE — Patient Instructions (Signed)
YOU HAD AN ENDOSCOPIC PROCEDURE TODAY AT [facility]: Refer to the procedure report that was given to you for any specific questions about what was found during the examination.  If the procedure report does not answer your questions, please call your gastroenterologist to clarify.  If you requested that your care partner not be given the details of your procedure findings, then the procedure report has been included in a sealed envelope for you to review at your convenience later.  YOU SHOULD EXPECT: Some feelings of bloating in the abdomen. Passage of more gas than usual.  Walking can help get rid of the air that was put into your GI tract during the procedure and reduce the bloating. If you had a lower endoscopy (such as a colonoscopy or flexible sigmoidoscopy) you may notice spotting of blood in your stool or on the toilet paper. If you underwent a bowel prep for your procedure, then you may not have a normal bowel movement for a few days.  DIET: Your first meal following the procedure should be a light meal and then it is ok to progress to your normal diet.  A half-sandwich or bowl of soup is an example of a good first meal.  Heavy or fried foods are harder to digest and may make you feel nauseous or bloated.  Likewise meals heavy in dairy and vegetables can cause extra gas to form and this can also increase the bloating.  Drink plenty of fluids but you should avoid alcoholic beverages for 24 hours.  ACTIVITY: Your care partner should take you home directly after the procedure.  You should plan to take it easy, moving slowly for the rest of the day.  You can resume normal activity the day after the procedure however you should NOT DRIVE or use heavy machinery for 24 hours (because of the sedation medicines used during the test).    SYMPTOMS TO REPORT IMMEDIATELY: A gastroenterologist can be reached at any hour.  During normal business hours, 8:30 AM to 5:00 PM Monday through Friday,  call [phone].  After hours and on weekends, please call the GI answering service at [phone] who will take a message and have the physician on call contact you.   Following lower endoscopy (colonoscopy or flexible sigmoidoscopy):  Excessive amounts of blood in the stool  Significant tenderness or worsening of abdominal pains  Swelling of the abdomen that is new, acute  Fever of 100F or higher   FOLLOW UP: If any biopsies were taken you will be contacted by phone or by letter within the next 1-3 weeks.  Call your gastroenterologist if you have not heard about the biopsies in 3 weeks.  Our staff will call the home number listed on your records the next business day following your procedure to check on you and address any questions or concerns that you may have at that time regarding the information given to you following your procedure. This is a courtesy call and so if there is no answer at the home number and we have not heard from you through the emergency physician on call, we will assume that you have returned to your regular daily activities without incident.  SIGNATURES/CONFIDENTIALITY: You and/or your care partner have signed paperwork which will be entered into your electronic medical record.  These signatures attest to the fact that that the information above on your After Visit Summary has been reviewed and is understood.  Full responsibility of the confidentiality of  this discharge information lies with you and/or your care-partner.    HOLD ASPIRIN AND NSAIDS FOR 1 WEEK!  Eat a high fiber diet due to you diverticulosis and hemorrhoids.

## 2011-08-09 ENCOUNTER — Telehealth: Payer: Self-pay | Admitting: *Deleted

## 2011-08-09 NOTE — Telephone Encounter (Signed)
  Follow up Call-  Call back number 08/08/2011  Post procedure Call Back phone  # [phone]  Permission to leave phone message Yes     No answer, message left.

## 2011-08-13 ENCOUNTER — Encounter: Payer: Self-pay | Admitting: Internal Medicine

## 2011-09-06 ENCOUNTER — Encounter: Payer: Self-pay | Admitting: Family

## 2011-09-06 ENCOUNTER — Ambulatory Visit (INDEPENDENT_AMBULATORY_CARE_PROVIDER_SITE_OTHER): Payer: 59 | Admitting: Family

## 2011-09-06 VITALS — BP 124/86 | Temp 98.8°F | Wt 133.0 lb

## 2011-09-06 DIAGNOSIS — L02219 Cutaneous abscess of trunk, unspecified: Secondary | ICD-10-CM

## 2011-09-06 DIAGNOSIS — L03319 Cellulitis of trunk, unspecified: Secondary | ICD-10-CM

## 2011-09-06 DIAGNOSIS — L03324 Acute lymphangitis of groin: Secondary | ICD-10-CM

## 2011-09-06 LAB — CBC WITH DIFFERENTIAL/PLATELET
Basophils Relative: 0.6 % (ref 0.0–3.0)
Eosinophils Relative: 1.7 % (ref 0.0–5.0)
Lymphocytes Relative: 20.3 % (ref 12.0–46.0)
Monocytes Relative: 7.5 % (ref 3.0–12.0)
Neutrophils Relative %: 69.9 % (ref 43.0–77.0)
RBC: 4.33 Mil/uL (ref 3.87–5.11)
WBC: 8.5 10*3/uL (ref 4.5–10.5)

## 2011-09-06 NOTE — Progress Notes (Signed)
Subjective:    Patient ID: Mary Berg, female    DOB: [birth year], 36 y.o.   MRN: [id number]  HPI Comments: C/o nonpainful lymph nodes left groin. Denies any potential exposure to STD, vaginal discharge or itching, fever, chills, or urinary s/s. Does not know how long the lymph nodes have been inflamed.      Review of Systems  Respiratory: Negative.   Cardiovascular: Negative.   Gastrointestinal: Negative.   Genitourinary: Negative.    Past Medical History  Diagnosis Date  . Anemia   . Migraine headache   . DM (diabetes mellitus), gestational   . Anxiety     History   Social History  . Marital Status: Married    Spouse Name: N/A    Number of Children: 3  . Years of Education: N/A   Occupational History  . Not on file.   Social History Main Topics  . Smoking status: Former Smoker -- 1.0 packs/day for 10 years    Types: Cigarettes  . Smokeless tobacco: Never Used  . Alcohol Use: 5.4 oz/week    3 Glasses of wine, 3 Cans of beer, 3 Shots of liquor per week     10 drinks a week   . Drug Use: No  . Sexually Active: Yes    Birth Control/ Protection: IUD   Other Topics Concern  . Not on file   Social History Narrative   Daily caffeine     Past Surgical History  Procedure Date  . Tab     history of one uncomplicated TAB in 1995  . Cesarean section     primary uncomplicated in 2007  . Tonsillectomy     Family History  Problem Relation Age of Onset  . Nephrolithiasis    . Arthritis    . Skin cancer    . Hypertension    . Coronary artery disease      no premature in immediate family  . Colon cancer Neg Hx   . Colon polyps Mother   . Breast cancer Paternal Aunt   . Diabetes Paternal Aunt   . Heart disease Paternal Grandfather     Allergies  Allergen Reactions  . Erythromycin     REACTION: sensitive stomach    Current Outpatient Prescriptions on File Prior to Visit  Medication Sig Dispense Refill  . Ibuprofen (MOTRIN IB PO) Take by mouth as  needed.      . vitamin C (ASCORBIC ACID) 500 MG tablet Take 500 mg by mouth daily.        BP 124/86  Temp(Src) 98.8 F (37.1 C) (Oral)  Wt 133 lb (60.328 kg)chart    Objective:   Physical Exam  Constitutional: She appears well-developed and well-nourished. No distress.  Cardiovascular: Normal rate, regular rhythm, normal heart sounds and intact distal pulses.  Exam reveals no gallop and no friction rub.   No murmur heard. Pulmonary/Chest: Effort normal and breath sounds normal. No respiratory distress. She has no wheezes. She has no rales. She exhibits no tenderness.  Abdominal: Soft. Bowel sounds are normal. She exhibits no distension and no mass. There is no tenderness. There is no rebound and no guarding.  Genitourinary:       Lymph nodes lt groin swollen, nontender to touch.  Skin: Skin is warm and dry. She is not diaphoretic.          Assessment & Plan:  Assessment: Lymphadenopathy of groin   Plan: Labs: HSV, CBC Teaching handout provided on diagnosis and treatment. Opportunity  for questions provided. Encouraged to RTC if s/s get worse

## 2011-10-25 ENCOUNTER — Ambulatory Visit (INDEPENDENT_AMBULATORY_CARE_PROVIDER_SITE_OTHER): Payer: 59 | Admitting: Licensed Clinical Social Worker

## 2011-10-25 DIAGNOSIS — F410 Panic disorder [episodic paroxysmal anxiety] without agoraphobia: Secondary | ICD-10-CM

## 2011-11-09 ENCOUNTER — Ambulatory Visit (INDEPENDENT_AMBULATORY_CARE_PROVIDER_SITE_OTHER): Payer: 59 | Admitting: Licensed Clinical Social Worker

## 2011-11-09 DIAGNOSIS — F410 Panic disorder [episodic paroxysmal anxiety] without agoraphobia: Secondary | ICD-10-CM

## 2011-11-23 ENCOUNTER — Ambulatory Visit: Payer: 59 | Admitting: Licensed Clinical Social Worker

## 2011-12-07 ENCOUNTER — Ambulatory Visit: Payer: 59 | Admitting: Licensed Clinical Social Worker

## 2011-12-24 ENCOUNTER — Ambulatory Visit (INDEPENDENT_AMBULATORY_CARE_PROVIDER_SITE_OTHER): Payer: 59 | Admitting: Licensed Clinical Social Worker

## 2011-12-24 DIAGNOSIS — F410 Panic disorder [episodic paroxysmal anxiety] without agoraphobia: Secondary | ICD-10-CM

## 2012-01-07 ENCOUNTER — Ambulatory Visit (INDEPENDENT_AMBULATORY_CARE_PROVIDER_SITE_OTHER): Payer: 59 | Admitting: Licensed Clinical Social Worker

## 2012-01-07 DIAGNOSIS — F410 Panic disorder [episodic paroxysmal anxiety] without agoraphobia: Secondary | ICD-10-CM

## 2012-02-01 ENCOUNTER — Ambulatory Visit: Payer: 59 | Admitting: Licensed Clinical Social Worker

## 2012-02-08 ENCOUNTER — Ambulatory Visit (INDEPENDENT_AMBULATORY_CARE_PROVIDER_SITE_OTHER): Payer: 59 | Admitting: Licensed Clinical Social Worker

## 2012-02-08 DIAGNOSIS — F410 Panic disorder [episodic paroxysmal anxiety] without agoraphobia: Secondary | ICD-10-CM

## 2012-03-07 ENCOUNTER — Ambulatory Visit (INDEPENDENT_AMBULATORY_CARE_PROVIDER_SITE_OTHER): Payer: 59 | Admitting: Licensed Clinical Social Worker

## 2012-03-07 DIAGNOSIS — F410 Panic disorder [episodic paroxysmal anxiety] without agoraphobia: Secondary | ICD-10-CM

## 2012-04-04 ENCOUNTER — Ambulatory Visit (INDEPENDENT_AMBULATORY_CARE_PROVIDER_SITE_OTHER): Payer: 59 | Admitting: Licensed Clinical Social Worker

## 2012-04-04 DIAGNOSIS — F410 Panic disorder [episodic paroxysmal anxiety] without agoraphobia: Secondary | ICD-10-CM

## 2012-04-18 ENCOUNTER — Ambulatory Visit (INDEPENDENT_AMBULATORY_CARE_PROVIDER_SITE_OTHER): Payer: 59 | Admitting: Licensed Clinical Social Worker

## 2012-04-18 DIAGNOSIS — F410 Panic disorder [episodic paroxysmal anxiety] without agoraphobia: Secondary | ICD-10-CM

## 2012-04-28 ENCOUNTER — Ambulatory Visit (INDEPENDENT_AMBULATORY_CARE_PROVIDER_SITE_OTHER): Payer: 59 | Admitting: Licensed Clinical Social Worker

## 2012-04-28 DIAGNOSIS — F410 Panic disorder [episodic paroxysmal anxiety] without agoraphobia: Secondary | ICD-10-CM

## 2012-12-19 ENCOUNTER — Ambulatory Visit (INDEPENDENT_AMBULATORY_CARE_PROVIDER_SITE_OTHER): Payer: 59 | Admitting: Licensed Clinical Social Worker

## 2012-12-19 DIAGNOSIS — F410 Panic disorder [episodic paroxysmal anxiety] without agoraphobia: Secondary | ICD-10-CM

## 2013-01-09 ENCOUNTER — Ambulatory Visit: Payer: 59 | Admitting: Licensed Clinical Social Worker

## 2013-01-16 ENCOUNTER — Ambulatory Visit (INDEPENDENT_AMBULATORY_CARE_PROVIDER_SITE_OTHER): Payer: No Typology Code available for payment source | Admitting: Licensed Clinical Social Worker

## 2013-01-16 DIAGNOSIS — F410 Panic disorder [episodic paroxysmal anxiety] without agoraphobia: Secondary | ICD-10-CM

## 2013-02-06 ENCOUNTER — Ambulatory Visit: Payer: 59 | Admitting: Licensed Clinical Social Worker

## 2013-02-13 ENCOUNTER — Ambulatory Visit (INDEPENDENT_AMBULATORY_CARE_PROVIDER_SITE_OTHER): Payer: No Typology Code available for payment source | Admitting: Licensed Clinical Social Worker

## 2013-02-13 DIAGNOSIS — F410 Panic disorder [episodic paroxysmal anxiety] without agoraphobia: Secondary | ICD-10-CM

## 2013-02-27 ENCOUNTER — Ambulatory Visit (INDEPENDENT_AMBULATORY_CARE_PROVIDER_SITE_OTHER): Payer: No Typology Code available for payment source | Admitting: Licensed Clinical Social Worker

## 2013-02-27 DIAGNOSIS — F410 Panic disorder [episodic paroxysmal anxiety] without agoraphobia: Secondary | ICD-10-CM

## 2013-03-20 ENCOUNTER — Ambulatory Visit (INDEPENDENT_AMBULATORY_CARE_PROVIDER_SITE_OTHER): Payer: No Typology Code available for payment source | Admitting: Licensed Clinical Social Worker

## 2013-03-20 DIAGNOSIS — F410 Panic disorder [episodic paroxysmal anxiety] without agoraphobia: Secondary | ICD-10-CM

## 2013-04-06 ENCOUNTER — Other Ambulatory Visit (INDEPENDENT_AMBULATORY_CARE_PROVIDER_SITE_OTHER): Payer: No Typology Code available for payment source

## 2013-04-06 DIAGNOSIS — Z0000 Encounter for general adult medical examination without abnormal findings: Secondary | ICD-10-CM

## 2013-04-06 LAB — BASIC METABOLIC PANEL
CO2: 23 mEq/L (ref 19–32)
Chloride: 106 mEq/L (ref 96–112)
GFR: 90.53 mL/min (ref >60.00)
Glucose, Bld: 96 mg/dL (ref 70–99)
Potassium: 3.8 mEq/L (ref 3.5–5.1)
Sodium: 138 mEq/L (ref 135–145)

## 2013-04-06 LAB — HEPATIC FUNCTION PANEL
ALT: 13 U/L (ref 0–35)
AST: 16 U/L (ref 0–37)
Albumin: 4.1 g/dL (ref 3.5–5.2)
Alkaline Phosphatase: 42 U/L (ref 39–117)
Total Protein: 6.4 g/dL (ref 6.0–8.3)

## 2013-04-06 LAB — CBC WITH DIFFERENTIAL/PLATELET
Basophils Absolute: 0 10*3/uL (ref 0.0–0.1)
Lymphs Abs: 1.9 10*3/uL (ref 0.7–4.0)
MCHC: 34.3 g/dL (ref 30.0–36.0)
MCV: 89.5 fl (ref 78.0–100.0)
Monocytes Absolute: 0.5 10*3/uL (ref 0.1–1.0)
Neutro Abs: 3.4 10*3/uL (ref 1.4–7.7)
Neutrophils Relative %: 54.2 % (ref 43.0–77.0)
Platelets: 228 10*3/uL (ref 150.0–400.0)
WBC: 6.3 10*3/uL (ref 4.5–10.5)

## 2013-04-06 LAB — LIPID PANEL
LDL Cholesterol: 75 mg/dL (ref 0–99)
Total CHOL/HDL Ratio: 2
Triglycerides: 38 mg/dL (ref 0.0–149.0)
VLDL: 7.6 mg/dL (ref 0.0–40.0)

## 2013-04-06 LAB — POCT URINALYSIS DIPSTICK
Blood, UA: NEGATIVE
Nitrite, UA: NEGATIVE
Protein, UA: NEGATIVE
Spec Grav, UA: 1.015
Urobilinogen, UA: 0.2
pH, UA: 7.5

## 2013-04-06 LAB — TSH: TSH: 2.06 u[IU]/mL (ref 0.35–5.50)

## 2013-04-10 ENCOUNTER — Ambulatory Visit (INDEPENDENT_AMBULATORY_CARE_PROVIDER_SITE_OTHER): Payer: No Typology Code available for payment source | Admitting: Licensed Clinical Social Worker

## 2013-04-10 DIAGNOSIS — F410 Panic disorder [episodic paroxysmal anxiety] without agoraphobia: Secondary | ICD-10-CM

## 2013-04-24 ENCOUNTER — Ambulatory Visit (INDEPENDENT_AMBULATORY_CARE_PROVIDER_SITE_OTHER): Payer: No Typology Code available for payment source | Admitting: Internal Medicine

## 2013-04-24 ENCOUNTER — Encounter: Payer: Self-pay | Admitting: Internal Medicine

## 2013-04-24 VITALS — BP 140/78 | HR 96 | Temp 97.7°F | Ht 65.0 in | Wt 140.0 lb

## 2013-04-24 DIAGNOSIS — M549 Dorsalgia, unspecified: Secondary | ICD-10-CM

## 2013-04-24 DIAGNOSIS — D179 Benign lipomatous neoplasm, unspecified: Secondary | ICD-10-CM

## 2013-04-24 DIAGNOSIS — Z0000 Encounter for general adult medical examination without abnormal findings: Secondary | ICD-10-CM

## 2013-04-24 DIAGNOSIS — F411 Generalized anxiety disorder: Secondary | ICD-10-CM

## 2013-04-24 MED ORDER — FLUOXETINE HCL (PMDD) 10 MG PO TABS: 10.0000 mg | ORAL_TABLET | Freq: Every day | ORAL | Status: DC

## 2013-04-24 MED ORDER — ALPRAZOLAM 0.25 MG PO TABS: 0.2500 mg | ORAL_TABLET | Freq: Every day | ORAL | Status: DC | PRN

## 2013-04-24 NOTE — Assessment & Plan Note (Signed)
Reviewed adult health maintenance protocols. Weight is normal.  She eats healthy and exercises occasionally.  Screening labs are normal.

## 2013-04-24 NOTE — Progress Notes (Signed)
Subjective:    Patient ID: Mary Berg, female    DOB: [birth year], 37 y.o.   MRN: [id number]  HPI  37 y/o white female for routine cpx.  No significant interval med hx.  She is followed by GYN and is up to date with PAP and pelvic.  No significant change in family hx.  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  Screening labs reviewed.  She complains of soft tissue lump near right lower ribs.  Non tender.  She first noticed 1-2 weeks ago.  She also complains of chronic upper back pain.  No hx of injury or trauma.  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    Review of Systems  Constitutional: Negative for activity change, appetite change and unexpected weight change.  Eyes: Negative for visual disturbance.  Respiratory: Negative for cough, chest tightness and shortness of breath.   Cardiovascular: Negative for chest pain.  Genitourinary: Negative for difficulty urinating.  Neurological: Negative for headaches.  Gastrointestinal: Negative for abdominal pain, heartburn melena or hematochezia Psych: Positive for anxiety Endo:  No polyuria or polydypsia Musculoskeletal: no upper extremity pain or weakness        Past Medical History  Diagnosis Date  . Anemia   . Migraine headache   . DM (diabetes mellitus), gestational   . Anxiety     History   Social History  . Marital Status: Married    Spouse Name: N/A    Number of Children: 3  . Years of Education: N/A   Occupational History  . Not on file.   Social History Main Topics  . Smoking status: Former Smoker -- 1.00 packs/day for 10 years    Types: Cigarettes  . Smokeless tobacco: Never Used  . Alcohol Use: 5.4 oz/week    3 Glasses of wine, 3 Cans of beer, 3 Shots of liquor per week     Comment: 10 drinks a week   . Drug Use: No  . Sexual Activity: Yes    Birth Control/ Protection: IUD   Other Topics Concern  . Not on file   Social History Narrative   Daily caffeine     Past Surgical History  Procedure  Laterality Date  . Tab      history of one uncomplicated TAB in 1995  . Cesarean section      primary uncomplicated in 2007  . Tonsillectomy      Family History  Problem Relation Age of Onset  . Nephrolithiasis    . Arthritis    . Skin cancer    . Hypertension    . Coronary artery disease      no premature in immediate family  . Colon cancer Neg Hx   . Colon polyps Mother   . Breast cancer Paternal Aunt   . Diabetes Paternal Aunt   . Heart disease Paternal Grandfather     Allergies  Allergen Reactions  . Erythromycin     REACTION: sensitive stomach    Current Outpatient Prescriptions on File Prior to Visit  Medication Sig Dispense Refill  . Ibuprofen (MOTRIN IB PO) Take by mouth as needed.      . vitamin C (ASCORBIC ACID) 500 MG tablet Take 500 mg by mouth daily.       No current facility-administered medications on file prior to visit.    BP 140/78  Pulse 96  Temp(Src) 97.7 F (36.5 C) (Oral)  Ht 5\' 5"  (1.651 m)  Wt 140 lb (63.504 kg)  BMI 23.30 kg/m2    Objective:   Physical Exam  Constitutional: She is oriented to person, place, and time. She appears well-developed and well-nourished. No distress.  HENT:  Head: Normocephalic and atraumatic.  Right Ear: External ear normal.  Left Ear: External ear normal.  Mouth/Throat: Oropharynx is clear and moist.  Eyes: Conjunctivae and EOM are normal. Pupils are equal, round, and reactive to light.  Neck: Normal range of motion. Neck supple.  No carotid bruit  Cardiovascular: Normal rate, regular rhythm and normal heart sounds.   Pulmonary/Chest: Effort normal and breath sounds normal. She has no wheezes.  Abdominal: Soft. Bowel sounds are normal. There is no tenderness.  Musculoskeletal: Normal range of motion. She exhibits no edema.  Lymphadenopathy:    She has no cervical adenopathy.  Neurological: She is alert and oriented to person, place, and time. No cranial nerve deficit.  Skin: Skin is warm and dry.   Oblong shaped superficial soft tissue mass right lower ribs.  Non tender.  Psychiatric: She has a normal mood and affect. Her behavior is normal.          Assessment & Plan:

## 2013-04-24 NOTE — Assessment & Plan Note (Signed)
Diazepam causing somnolence.  Start low dose fluoxetine.  We discussed common side effects.  Use alprazolam for panic symptoms.  Reassess in 1-2 months.

## 2013-04-24 NOTE — Progress Notes (Signed)
Pre visit review using our clinic review tool, if applicable. No additional management support is needed unless otherwise documented below in the visit note. 

## 2013-04-24 NOTE — Assessment & Plan Note (Signed)
She has probable small lipoma near right lower ribs.  We will consider intervention if lipoma gets bigger or symptomatic.

## 2013-05-01 ENCOUNTER — Ambulatory Visit: Payer: No Typology Code available for payment source | Admitting: Licensed Clinical Social Worker

## 2013-05-02 IMAGING — US US TRANSVAGINAL NON-OB
1 series · 14 of 25 positions shown · non-contrast
Comparison: None.

CLINICAL DATA: Pain on left; IUD

TRANSABDOMINAL AND TRANSVAGINAL ULTRASOUND OF PELVIS
TECHNIQUE: Both transabdominal and transvaginal ultrasound
examinations of the pelvis were performed. Transabdominal technique
was performed for global imaging of the pelvis including uterus,
ovaries, adnexal regions, and pelvic cul-de-sac.

[Series 1: us transvaginal non-ob · 0.27mm/px · 14 of 79 slices shown]
[im 1/79]
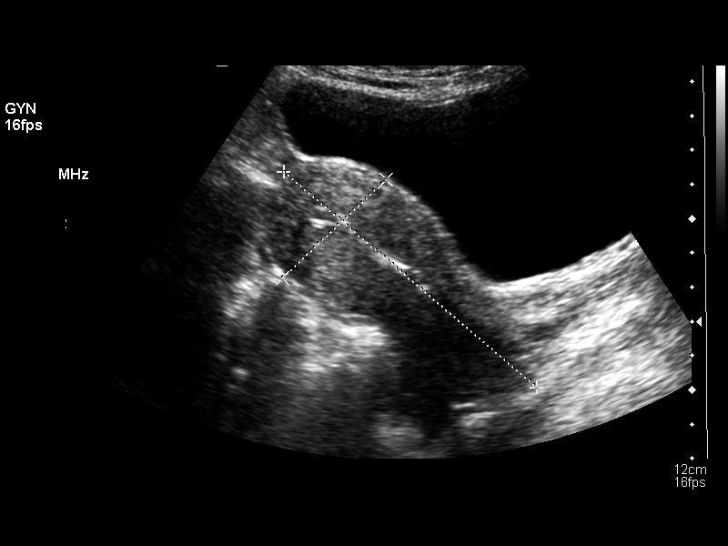
[im 7/79]
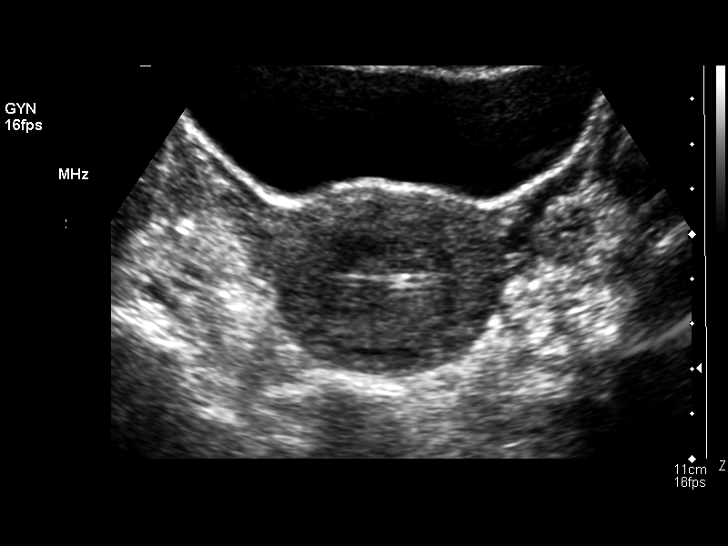
[im 14/79]
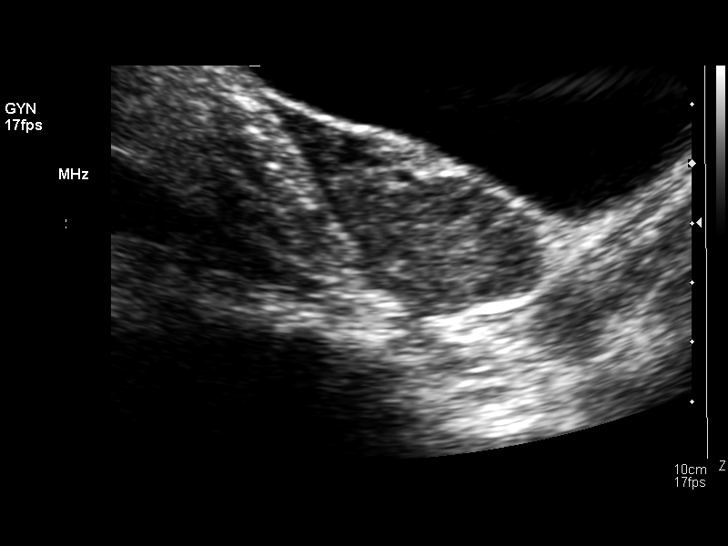
[im 20/79]
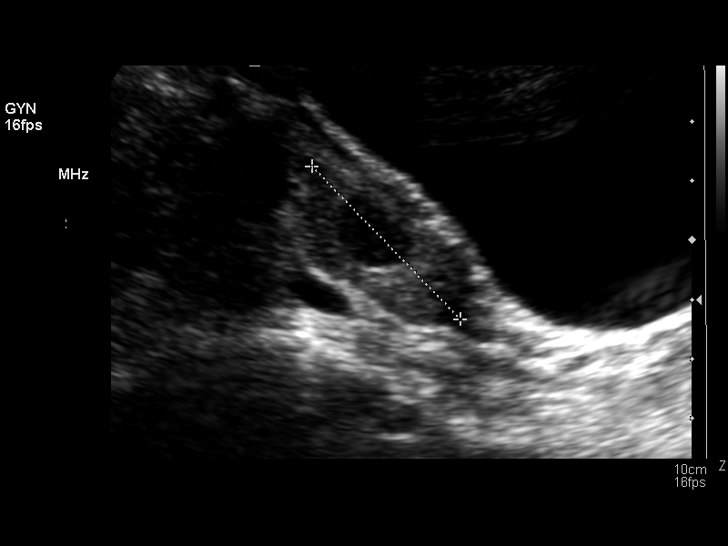
[im 27/79]
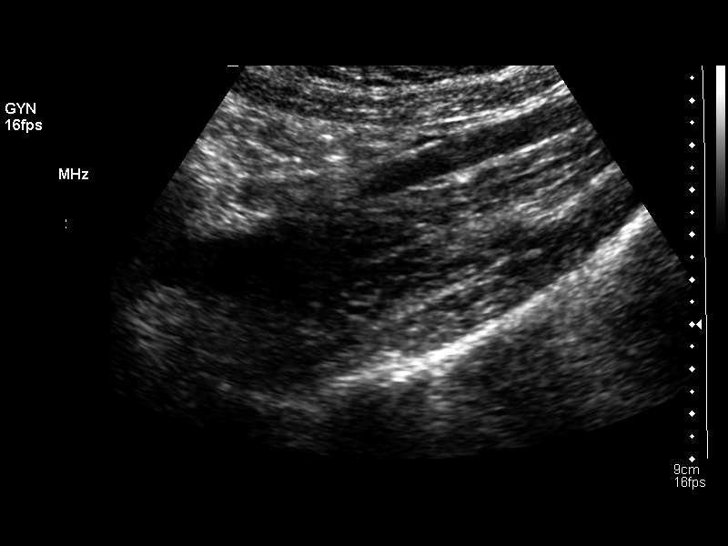
[im 30/79]
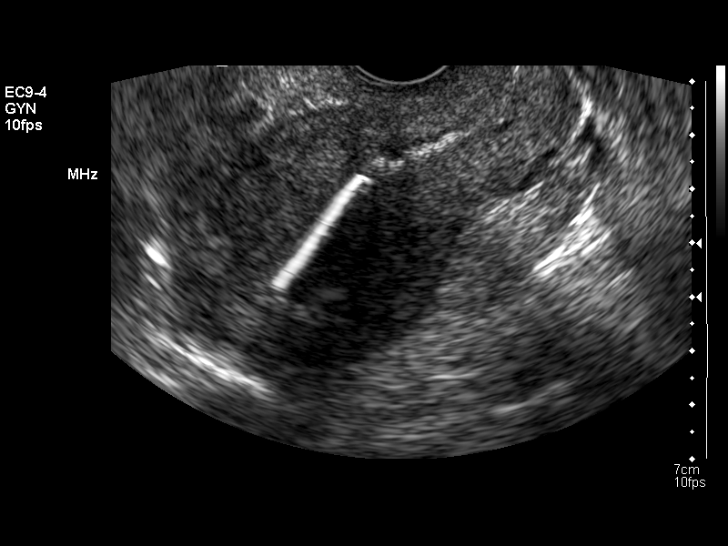
[im 36/79]
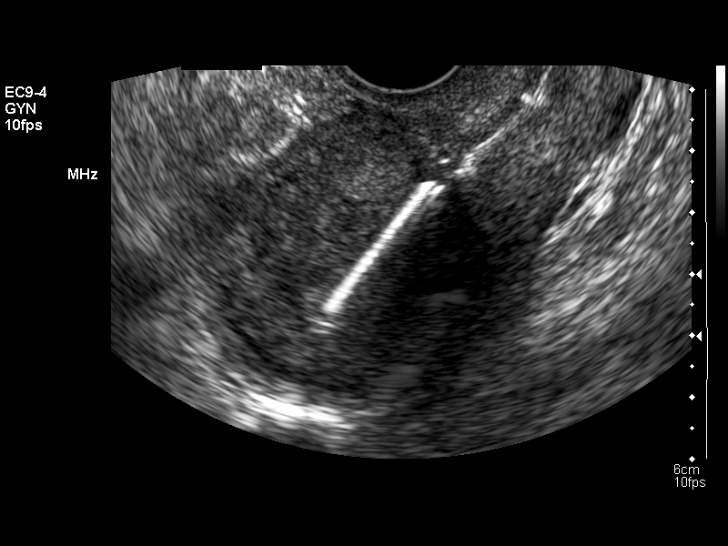
[im 43/79]
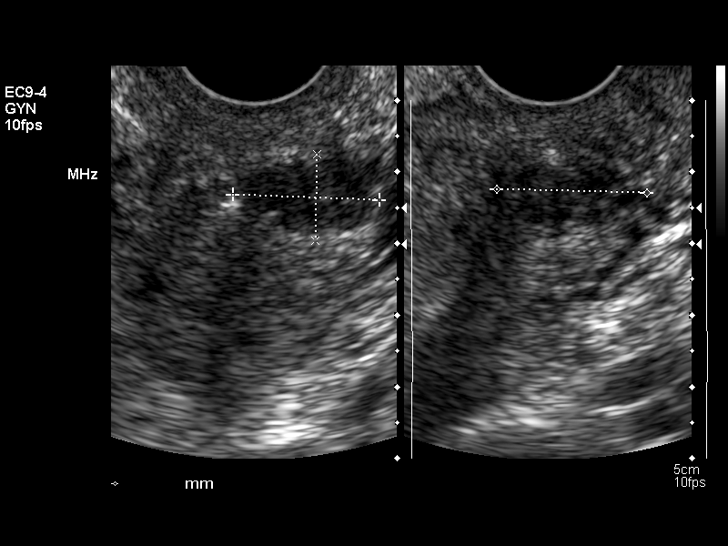
[im 49/79]
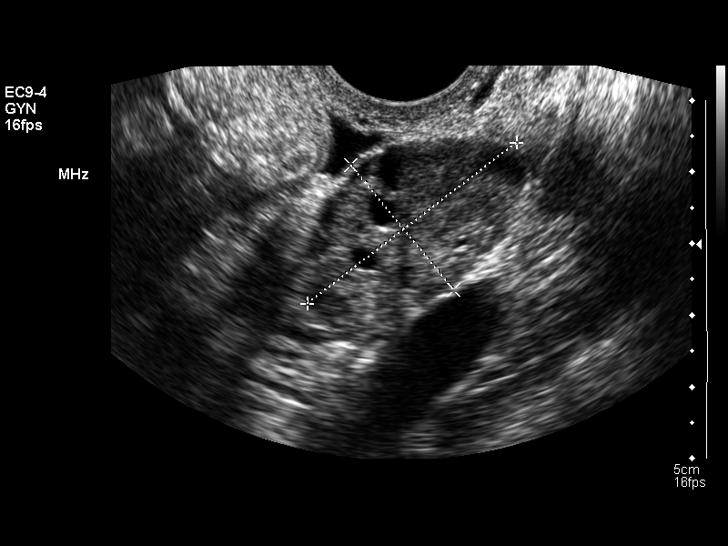
[im 53/79]
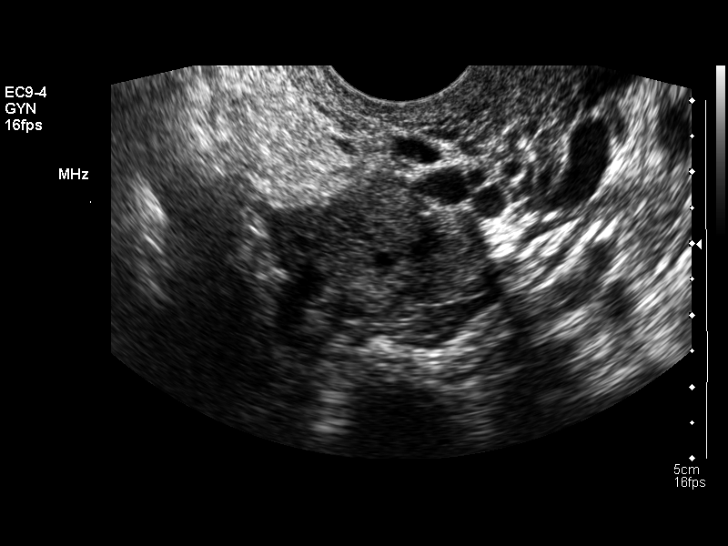
[im 59/79]
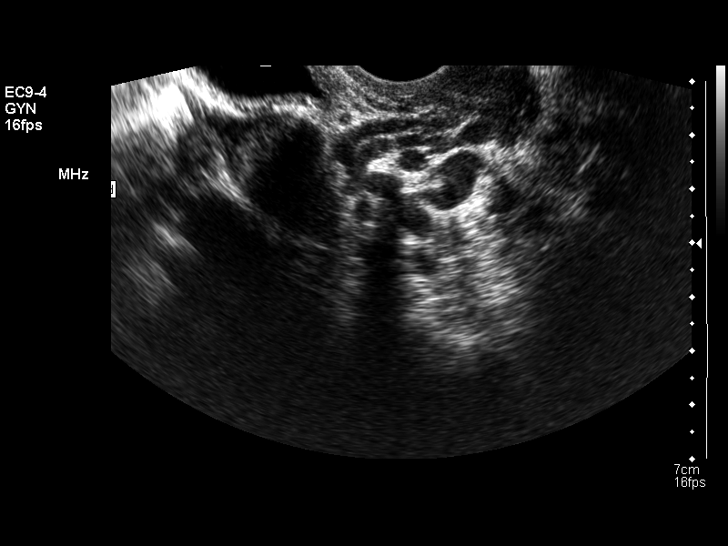
[im 66/79]
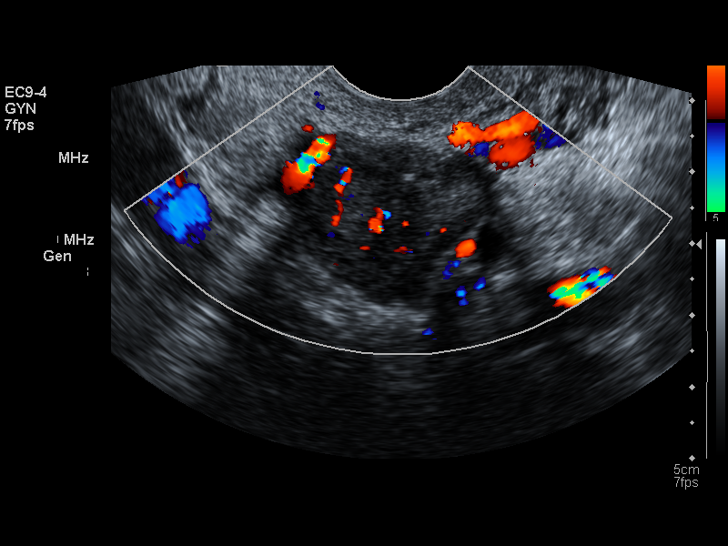
[im 72/79]
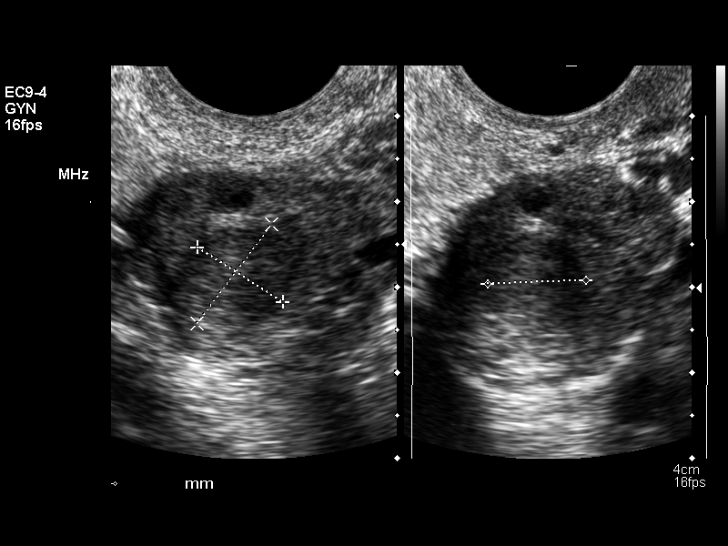
[im 79/79]
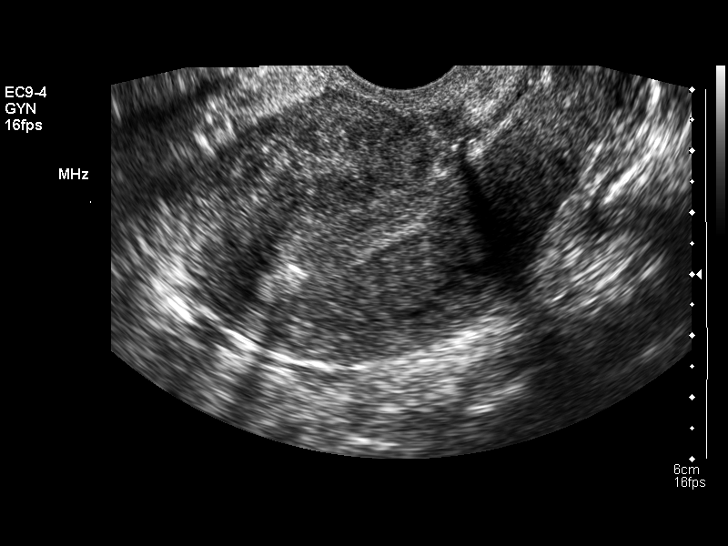

[14 of 25 positions shown; findings below may reference images not displayed]

It was necessary to proceed with endovaginal exam following the
transabdominal exam to visualize the adnexa.
FINDINGS: The uterus is normal in size and echotexture, measuring 8.6 x 4.8 x
5.2 cm.  The endometrial stripe is thin and homogeneous, measuring
9 mm in width.  The IUD is in the proper location within the
endometrium at the fundus.  There is a 2.1 cm fibroid at the
anterior lower uterine segment.

Both ovaries have a normal size and appearance.  The right ovary
measures 3.7 x 2.4 x 2.6 cm, and the left ovary measures 3.7 x
x 3.0 cm.  There are no adnexal masses or free pelvic fluid.
IMPRESSION: There is a 2.1 cm myometrial fibroid at the anterior lower uterine
segment.

The IUD is in the proper location.

Normal ovaries..

## 2013-05-04 ENCOUNTER — Ambulatory Visit: Payer: No Typology Code available for payment source | Attending: Internal Medicine

## 2013-05-04 DIAGNOSIS — M542 Cervicalgia: Secondary | ICD-10-CM | POA: Insufficient documentation

## 2013-05-04 DIAGNOSIS — IMO0001 Reserved for inherently not codable concepts without codable children: Secondary | ICD-10-CM | POA: Insufficient documentation

## 2013-05-04 DIAGNOSIS — M546 Pain in thoracic spine: Secondary | ICD-10-CM | POA: Insufficient documentation

## 2013-05-11 ENCOUNTER — Ambulatory Visit: Payer: No Typology Code available for payment source | Attending: Internal Medicine | Admitting: Physical Therapy

## 2013-05-11 DIAGNOSIS — M546 Pain in thoracic spine: Secondary | ICD-10-CM | POA: Insufficient documentation

## 2013-05-11 DIAGNOSIS — IMO0001 Reserved for inherently not codable concepts without codable children: Secondary | ICD-10-CM | POA: Insufficient documentation

## 2013-05-11 DIAGNOSIS — M542 Cervicalgia: Secondary | ICD-10-CM | POA: Insufficient documentation

## 2013-05-15 ENCOUNTER — Ambulatory Visit (INDEPENDENT_AMBULATORY_CARE_PROVIDER_SITE_OTHER): Payer: No Typology Code available for payment source | Admitting: Licensed Clinical Social Worker

## 2013-05-15 DIAGNOSIS — F410 Panic disorder [episodic paroxysmal anxiety] without agoraphobia: Secondary | ICD-10-CM

## 2013-05-22 ENCOUNTER — Ambulatory Visit: Payer: No Typology Code available for payment source | Admitting: Physical Therapy

## 2013-06-01 ENCOUNTER — Ambulatory Visit: Payer: No Typology Code available for payment source | Admitting: Physical Therapy

## 2013-06-08 ENCOUNTER — Ambulatory Visit: Payer: Self-pay | Admitting: Internal Medicine

## 2013-06-19 ENCOUNTER — Ambulatory Visit: Payer: No Typology Code available for payment source | Admitting: Licensed Clinical Social Worker

## 2013-06-29 ENCOUNTER — Ambulatory Visit (INDEPENDENT_AMBULATORY_CARE_PROVIDER_SITE_OTHER): Payer: No Typology Code available for payment source | Admitting: Internal Medicine

## 2013-06-29 ENCOUNTER — Encounter: Payer: Self-pay | Admitting: Internal Medicine

## 2013-06-29 VITALS — BP 130/70 | HR 72 | Temp 98.3°F | Ht 65.0 in | Wt 148.0 lb

## 2013-06-29 DIAGNOSIS — D179 Benign lipomatous neoplasm, unspecified: Secondary | ICD-10-CM

## 2013-06-29 DIAGNOSIS — F411 Generalized anxiety disorder: Secondary | ICD-10-CM

## 2013-06-29 MED ORDER — FLUOXETINE HCL (PMDD) 10 MG PO TABS: 10.0000 mg | ORAL_TABLET | Freq: Every day | ORAL | Status: DC

## 2013-06-29 NOTE — Assessment & Plan Note (Signed)
Excellent response to low dose fluoxetine.  Continue same 10 mg dose. Patient has gained 8 pounds since previous visit.  Patient will monitor her weight more closely and start regular exercise program. She understands risk of weight gain with SSRIs.

## 2013-06-29 NOTE — Progress Notes (Signed)
Pre visit review using our clinic review tool, if applicable. No additional management support is needed unless otherwise documented below in the visit note. 

## 2013-06-29 NOTE — Patient Instructions (Signed)
Monitor your weight on a regular basis Restart weight watchers and regular exercise program

## 2013-06-29 NOTE — Progress Notes (Signed)
Subjective:    Patient ID: Mary Berg, female    DOB: 10-Feb-1976, 38 y.o.   MRN: [id number]  HPI  38 year old white female for followup regarding generalized anxiety disorder. At previous visit, patient was restarted on fluoxetine 10 mg. Patient reports excellent response. She reports she is more patient with her children, husband and her coworkers. She is very pleased with her medication response.  Her family has noticed dramatic improvement.  She has also noticed less perseveration with her health issues.  She reports minimal side effects. She has had mild sexual dysfunction.  Review of Systems 8 lb weight gain    Past Medical History  Diagnosis Date  . Anemia   . Migraine headache   . DM (diabetes mellitus), gestational   . Anxiety     History   Social History  . Marital Status: Married    Spouse Name: N/A    Number of Children: 3  . Years of Education: N/A   Occupational History  . Not on file.   Social History Main Topics  . Smoking status: Former Smoker -- 1.00 packs/day for 10 years    Types: Cigarettes  . Smokeless tobacco: Never Used  . Alcohol Use: 5.4 oz/week    3 Glasses of wine, 3 Cans of beer, 3 Shots of liquor per week     Comment: 10 drinks a week   . Drug Use: No  . Sexual Activity: Yes    Birth Control/ Protection: IUD   Other Topics Concern  . Not on file   Social History Narrative   Daily caffeine     Past Surgical History  Procedure Laterality Date  . Tab      history of one uncomplicated TAB in 3818  . Cesarean section      primary uncomplicated in 2993  . Tonsillectomy      Family History  Problem Relation Age of Onset  . Nephrolithiasis    . Arthritis    . Skin cancer    . Hypertension    . Coronary artery disease      no premature in immediate family  . Colon cancer Neg Hx   . Colon polyps Mother   . Breast cancer Paternal Aunt   . Diabetes Paternal Aunt   . Heart disease Paternal Grandfather     Allergies    Allergen Reactions  . Erythromycin     REACTION: sensitive stomach    Current Outpatient Prescriptions on File Prior to Visit  Medication Sig Dispense Refill  . ALPRAZolam (XANAX) 0.25 MG tablet Take 1 tablet (0.25 mg total) by mouth daily as needed for anxiety.  30 tablet  0  . Ibuprofen (MOTRIN IB PO) Take by mouth as needed.      . vitamin C (ASCORBIC ACID) 500 MG tablet Take 500 mg by mouth daily.       No current facility-administered medications on file prior to visit.    BP 130/70  Pulse 72  Temp(Src) 98.3 F (36.8 C) (Oral)  Ht 5\' 5"  (1.651 m)  Wt 148 lb (67.132 kg)  BMI 24.63 kg/m2    Objective:   Physical Exam  Constitutional: She appears well-developed and well-nourished. No distress.  Cardiovascular: Normal rate, regular rhythm and normal heart sounds.   Pulmonary/Chest: Effort normal and breath sounds normal. She has no wheezes.  Skin: Skin is warm and dry.  3 cm - oblong SQ fatty area along right lower rib cage.  Assessment & Plan:

## 2013-06-29 NOTE — Assessment & Plan Note (Signed)
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 There has been no change in size or symptoms.  Continue to monitor.

## 2013-07-20 ENCOUNTER — Ambulatory Visit (INDEPENDENT_AMBULATORY_CARE_PROVIDER_SITE_OTHER): Payer: No Typology Code available for payment source | Admitting: Licensed Clinical Social Worker

## 2013-07-20 DIAGNOSIS — F410 Panic disorder [episodic paroxysmal anxiety] without agoraphobia: Secondary | ICD-10-CM

## 2013-07-22 ENCOUNTER — Ambulatory Visit (INDEPENDENT_AMBULATORY_CARE_PROVIDER_SITE_OTHER): Payer: PRIVATE HEALTH INSURANCE | Admitting: Physician Assistant

## 2013-07-22 VITALS — BP 124/60 | HR 104 | Temp 99.2°F | Resp 18 | Ht 65.5 in | Wt 139.8 lb

## 2013-07-22 DIAGNOSIS — R05 Cough: Secondary | ICD-10-CM

## 2013-07-22 DIAGNOSIS — J209 Acute bronchitis, unspecified: Secondary | ICD-10-CM

## 2013-07-22 DIAGNOSIS — R059 Cough, unspecified: Secondary | ICD-10-CM

## 2013-07-22 MED ORDER — BENZONATATE 200 MG PO CAPS: 200.0000 mg | ORAL_CAPSULE | Freq: Three times a day (TID) | ORAL | Status: DC | PRN

## 2013-07-22 MED ORDER — AZITHROMYCIN 250 MG PO TABS: ORAL_TABLET | ORAL | Status: DC

## 2013-07-22 MED ORDER — HYDROCODONE-HOMATROPINE 5-1.5 MG/5ML PO SYRP: 5.0000 mL | ORAL_SOLUTION | Freq: Three times a day (TID) | ORAL | Status: DC | PRN

## 2013-07-22 NOTE — Progress Notes (Signed)
   Subjective:    Patient ID: Laynee Lockamy, female    DOB: 02-Apr-1976, 38 y.o.   MRN: [id number]  HPI   Ms. Saulsbury is a very pleasant 38 yr old female here with concern for illness.  Reports over 2 wks of cough.  Cough is occasionally productive.  At the beginning of her illness she had some fever.  This resolved, but last night she began feeling worse again - fever to 101F.  She has some associated wheezing.  No SOB.  No asthma history.  Was having sinus pressure and green nasal drainage but this has improved, now with only clear rhinorrhea.  Taking ibuprofen for aches and pains.  Has 3 boys who have all had cold symptoms.   Review of Systems  Constitutional: Positive for fever. Negative for chills.  HENT: Positive for rhinorrhea and sore throat. Negative for congestion.   Respiratory: Positive for cough and wheezing. Negative for shortness of breath.   Cardiovascular: Negative.   Gastrointestinal: Negative.   Musculoskeletal: Negative.   Skin: Negative.        Objective:   Physical Exam  Vitals reviewed. Constitutional: She is oriented to person, place, and time. She appears well-developed and well-nourished. No distress.  HENT:  Head: Normocephalic and atraumatic.  Right Ear: Tympanic membrane and ear canal normal.  Left Ear: Tympanic membrane and ear canal normal.  Mouth/Throat: Uvula is midline, oropharynx is clear and moist and mucous membranes are normal.  Eyes: Conjunctivae are normal. No scleral icterus.  Neck: Neck supple.  Cardiovascular: Normal rate, regular rhythm and normal heart sounds.   Pulmonary/Chest: Effort normal and breath sounds normal. She has no wheezes. She has no rales.  Lymphadenopathy:    She has no cervical adenopathy.  Neurological: She is alert and oriented to person, place, and time.  Skin: Skin is warm and dry.  Psychiatric: She has a normal mood and affect. Her behavior is normal.       Assessment & Plan:  Acute bronchitis - Plan: azithromycin  (ZITHROMAX) 250 MG tablet, HYDROcodone-homatropine (HYCODAN) 5-1.5 MG/5ML syrup, benzonatate (TESSALON) 200 MG capsule  Cough - Plan: HYDROcodone-homatropine (HYCODAN) 5-1.5 MG/5ML syrup   Ms. Thackston is a very pleasant 38 yr old female here with 2 wks of cough, worsening yesterday into today.  Given her double sickening and recurrence of fever, I think it is reasonable to treat with abx.  Pt has prior intolerance to erythro as a child - GI symptoms, no symptoms of true allergy.  Will treat with azithro to cover atypicals.  Tessalon and Hycodan for cough.  Push fluids, rest.  Work note provided.  If worsening or not improving, pt to RTC>   E. Natividad Brood MHS, PA-C [facility] 2/11/20151:00 PM

## 2013-07-22 NOTE — Patient Instructions (Signed)
Take the azithromycin as directed.  Be sure to finish the full course.  (If you have symptoms concerning for an allergy - rash, swelling, hives, shortness of breath - please let us know right away)  Use the benzonatate every 8 hours as needed for cough  Use the Hycodan syrup if needed - may make you sleepy, would try only at bed time first  [place] of fluids (water is best!) and rest  Please let us know if any symptoms are worsening or not improving   Bronchitis Bronchitis is inflammation of the airways that extend from the windpipe into the lungs (bronchi). The inflammation often causes mucus to develop, which leads to a cough. If the inflammation becomes severe, it may cause shortness of breath. CAUSES  Bronchitis may be caused by:   Viral infections.   Bacteria.   Cigarette smoke.   Allergens, pollutants, and other irritants.  SIGNS AND SYMPTOMS  The most common symptom of bronchitis is a frequent cough that produces mucus. Other symptoms include:  Fever.   Body aches.   Chest congestion.   Chills.   Shortness of breath.   Sore throat.  DIAGNOSIS  Bronchitis is usually diagnosed through a medical history and physical exam. Tests, such as chest X-rays, are sometimes done to rule out other conditions.  TREATMENT  You may need to avoid contact with whatever caused the problem (smoking, for example). Medicines are sometimes needed. These may include:  Antibiotics. These may be prescribed if the condition is caused by bacteria.  Cough suppressants. These may be prescribed for relief of cough symptoms.   Inhaled medicines. These may be prescribed to help open your airways and make it easier for you to breathe.   Steroid medicines. These may be prescribed for those with recurrent (chronic) bronchitis. HOME CARE INSTRUCTIONS  Get plenty of rest.   Drink enough fluids to keep your urine clear or pale yellow (unless you have a medical condition that requires  fluid restriction). Increasing fluids may help thin your secretions and will prevent dehydration.   Only take over-the-counter or prescription medicines as directed by your health care provider.  Only take antibiotics as directed. Make sure you finish them even if you start to feel better.  Avoid secondhand smoke, irritating chemicals, and strong fumes. These will make bronchitis worse. If you are a smoker, quit smoking. Consider using nicotine gum or skin patches to help control withdrawal symptoms. Quitting smoking will help your lungs heal faster.   Put a cool-mist humidifier in your bedroom at night to moisten the air. This may help loosen mucus. Change the water in the humidifier daily. You can also run the hot water in your shower and sit in the bathroom with the door closed for 5 10 minutes.   Follow up with your health care provider as directed.   Wash your hands frequently to avoid catching bronchitis again or spreading an infection to others.  SEEK MEDICAL CARE IF: Your symptoms do not improve after 1 week of treatment.  SEEK IMMEDIATE MEDICAL CARE IF:  Your fever increases.  You have chills.   You have chest pain.   You have worsening shortness of breath.   You have bloody sputum.  You faint.  You have lightheadedness.  You have a severe headache.   You vomit repeatedly. MAKE SURE YOU:   Understand these instructions.  Will watch your condition.  Will get help right away if you are not doing well or get worse. Document Released:  05/28/2005 Document Revised: 03/18/2013 Document Reviewed: 01/20/2013 [facility] Patient Information 2014 [place].

## 2013-07-23 ENCOUNTER — Other Ambulatory Visit: Payer: Self-pay | Admitting: Internal Medicine

## 2013-07-24 ENCOUNTER — Other Ambulatory Visit: Payer: Self-pay | Admitting: *Deleted

## 2013-08-23 ENCOUNTER — Other Ambulatory Visit: Payer: Self-pay | Admitting: Internal Medicine

## 2013-09-18 ENCOUNTER — Other Ambulatory Visit: Payer: Self-pay | Admitting: Internal Medicine

## 2013-09-21 ENCOUNTER — Ambulatory Visit (INDEPENDENT_AMBULATORY_CARE_PROVIDER_SITE_OTHER): Payer: No Typology Code available for payment source | Admitting: Licensed Clinical Social Worker

## 2013-09-21 DIAGNOSIS — F410 Panic disorder [episodic paroxysmal anxiety] without agoraphobia: Secondary | ICD-10-CM

## 2013-09-28 ENCOUNTER — Encounter: Payer: Self-pay | Admitting: Internal Medicine

## 2013-09-28 ENCOUNTER — Ambulatory Visit (INDEPENDENT_AMBULATORY_CARE_PROVIDER_SITE_OTHER): Payer: No Typology Code available for payment source | Admitting: Internal Medicine

## 2013-09-28 VITALS — BP 122/74 | HR 80 | Temp 99.0°F | Ht 65.5 in | Wt 146.0 lb

## 2013-09-28 DIAGNOSIS — D179 Benign lipomatous neoplasm, unspecified: Secondary | ICD-10-CM

## 2013-09-28 DIAGNOSIS — F411 Generalized anxiety disorder: Secondary | ICD-10-CM

## 2013-09-28 MED ORDER — FLUOXETINE HCL (PMDD) 10 MG PO TABS: 10.0000 mg | ORAL_TABLET | Freq: Every day | ORAL | Status: DC

## 2013-09-28 NOTE — Progress Notes (Signed)
Subjective:    Patient ID: Mary Berg, female    DOB: 31-May-1976, 38 y.o.   MRN: [id number]  HPI  38 year old white female with history of mild anxiety disorder for routine followup. Patient doing very well on current dose of fluoxetine 10 mg once daily. Her medication was started in November of 2014. Her weight at that time was 140 pounds. There is an approximately 6 pound weight gain since starting medication. She has been very conscious to maintain her weight. She reports eating healthy diet and exercising regularly.  Patient reports taking fluoxetine has made a significant difference anxiety symptoms. She is feeling much better.   Review of Systems No sexual dysfunction.    Past Medical History  Diagnosis Date  . Anemia   . Migraine headache   . DM (diabetes mellitus), gestational   . Anxiety     History   Social History  . Marital Status: Married    Spouse Name: N/A    Number of Children: 3  . Years of Education: N/A   Occupational History  . Not on file.   Social History Main Topics  . Smoking status: Former Smoker -- 1.00 packs/day for 10 years    Types: Cigarettes  . Smokeless tobacco: Never Used  . Alcohol Use: 5.4 oz/week    3 Glasses of wine, 3 Cans of beer, 3 Shots of liquor per week     Comment: 10 drinks a week   . Drug Use: No  . Sexual Activity: Yes    Birth Control/ Protection: IUD   Other Topics Concern  . Not on file   Social History Narrative   Daily caffeine    Works park time as Forensic psychologist    Past Surgical History  Procedure Laterality Date  . Tab      history of one uncomplicated TAB in 9735  . Cesarean section      primary uncomplicated in 3299  . Tonsillectomy      Family History  Problem Relation Age of Onset  . Nephrolithiasis    . Arthritis    . Skin cancer    . Hypertension    . Coronary artery disease      no premature in immediate family  . Colon cancer Neg Hx   . Colon polyps Mother   . Breast cancer Paternal  Aunt   . Diabetes Paternal Aunt   . Heart disease Paternal Grandfather     Allergies  Allergen Reactions  . Erythromycin     REACTION: sensitive stomach    Current Outpatient Prescriptions on File Prior to Visit  Medication Sig Dispense Refill  . ALPRAZolam (XANAX) 0.25 MG tablet Take 1 tablet (0.25 mg total) by mouth daily as needed for anxiety.  30 tablet  0  . Ibuprofen (MOTRIN IB PO) Take by mouth as needed.      . vitamin C (ASCORBIC ACID) 500 MG tablet Take 500 mg by mouth daily.       No current facility-administered medications on file prior to visit.    BP 122/74  Pulse 80  Temp(Src) 99 F (37.2 C) (Oral)  Ht 5' 5.5" (1.664 m)  Wt 146 lb (66.225 kg)  BMI 23.92 kg/m2      Objective:   Physical Exam  Constitutional: She appears well-developed and well-nourished.  Cardiovascular: Normal rate, regular rhythm and normal heart sounds.   Pulmonary/Chest: Effort normal and breath sounds normal. She has no wheezes.  No change in size or consistency of  right lower rib lipoma  Psychiatric: She has a normal mood and affect. Her behavior is normal.          Assessment & Plan:

## 2013-09-28 NOTE — Assessment & Plan Note (Signed)
No change in size or consistency.  Continue to periodically monitor.

## 2013-09-28 NOTE — Progress Notes (Signed)
Pre visit review using our clinic review tool, if applicable. No additional management support is needed unless otherwise documented below in the visit note. 

## 2013-09-28 NOTE — Assessment & Plan Note (Signed)
Patient doing well.  Patient has only gained 6 pounds since initially starting fluoxetine in November of 2014.  Patient following healthy diet and is exercising regularly. Reassess in 6 months.

## 2013-11-04 ENCOUNTER — Other Ambulatory Visit: Payer: Self-pay | Admitting: Internal Medicine

## 2013-11-11 ENCOUNTER — Encounter: Payer: Self-pay | Admitting: Internal Medicine

## 2013-11-11 ENCOUNTER — Ambulatory Visit (INDEPENDENT_AMBULATORY_CARE_PROVIDER_SITE_OTHER): Payer: No Typology Code available for payment source | Admitting: Internal Medicine

## 2013-11-11 VITALS — BP 124/74 | HR 64 | Temp 98.8°F | Ht 65.5 in | Wt 147.0 lb

## 2013-11-11 DIAGNOSIS — F411 Generalized anxiety disorder: Secondary | ICD-10-CM

## 2013-11-11 NOTE — Assessment & Plan Note (Signed)
Patient concerned fluoxetine may cause additional weight gain.  Patient lost weight in the past by following weight watchers.  Patient to try interval increased intensity exercises.  If persistent weight gain, I suggest we try switching to citalopram 5 to 10 mg once daily.

## 2013-11-11 NOTE — Progress Notes (Signed)
Subjective:    Patient ID: Mary Berg, female    DOB: Dec 14, 1975, 38 y.o.   MRN: [id number]  HPI  38 year old white female with history of anxiety for followup. Patient previously seen on 09/28/2013. We discussed mild weight gain since starting fluoxetine 10 mg. She has not experienced significant change in her weight. However she is "working hard" to eat healthy diet and exercise a regular basis. This has not resulted in significant weight change. She is concerned about overall weight gain.  However, she still reports she feels much better and is also functioning better at work and at home.  We discussed her current diet and exercise regimen. Review of Systems No weight gain since previous visit.    Past Medical History  Diagnosis Date  . Anemia   . Migraine headache   . DM (diabetes mellitus), gestational   . Anxiety     History   Social History  . Marital Status: Married    Spouse Name: N/A    Number of Children: 3  . Years of Education: N/A   Occupational History  . Not on file.   Social History Main Topics  . Smoking status: Former Smoker -- 1.00 packs/day for 10 years    Types: Cigarettes  . Smokeless tobacco: Never Used  . Alcohol Use: 5.4 oz/week    3 Glasses of wine, 3 Cans of beer, 3 Shots of liquor per week     Comment: 10 drinks a week   . Drug Use: No  . Sexual Activity: Yes    Birth Control/ Protection: IUD   Other Topics Concern  . Not on file   Social History Narrative   Daily caffeine    Works park time as Forensic psychologist    Past Surgical History  Procedure Laterality Date  . Tab      history of one uncomplicated TAB in 7782  . Cesarean section      primary uncomplicated in 4235  . Tonsillectomy      Family History  Problem Relation Age of Onset  . Nephrolithiasis    . Arthritis    . Skin cancer    . Hypertension    . Coronary artery disease      no premature in immediate family  . Colon cancer Neg Hx   . Colon polyps Mother   .  Breast cancer Paternal Aunt   . Diabetes Paternal Aunt   . Heart disease Paternal Grandfather     Allergies  Allergen Reactions  . Erythromycin     REACTION: sensitive stomach    Current Outpatient Prescriptions on File Prior to Visit  Medication Sig Dispense Refill  . ALPRAZolam (XANAX) 0.25 MG tablet Take 1 tablet (0.25 mg total) by mouth daily as needed for anxiety.  30 tablet  0  . Ibuprofen (MOTRIN IB PO) Take by mouth as needed.      Marland Kitchen SARAFEM 10 MG TABS TAKE 1 TABLET BY MOUTH EVERY DAY  28 tablet  5  . vitamin C (ASCORBIC ACID) 500 MG tablet Take 500 mg by mouth daily.       No current facility-administered medications on file prior to visit.    BP 124/74  Pulse 64  Temp(Src) 98.8 F (37.1 C) (Oral)  Ht 5' 5.5" (1.664 m)  Wt 147 lb (66.679 kg)  BMI 24.08 kg/m2    Objective:   Physical Exam  Constitutional: She is oriented to person, place, and time. She appears well-developed and well-nourished.  Neck:  Neck supple. No thyromegaly present.  Cardiovascular: Normal rate, regular rhythm and normal heart sounds.   No murmur heard. Pulmonary/Chest: Effort normal and breath sounds normal. She has no wheezes.  Neurological: She is alert and oriented to person, place, and time.       Assessment & Plan:

## 2013-11-11 NOTE — Progress Notes (Signed)
Pre visit review using our clinic review tool, if applicable. No additional management support is needed unless otherwise documented below in the visit note. 

## 2013-11-16 ENCOUNTER — Ambulatory Visit (INDEPENDENT_AMBULATORY_CARE_PROVIDER_SITE_OTHER): Payer: No Typology Code available for payment source | Admitting: Licensed Clinical Social Worker

## 2013-11-16 DIAGNOSIS — F410 Panic disorder [episodic paroxysmal anxiety] without agoraphobia: Secondary | ICD-10-CM

## 2014-03-31 ENCOUNTER — Encounter: Payer: Self-pay | Admitting: Internal Medicine

## 2014-03-31 ENCOUNTER — Ambulatory Visit (INDEPENDENT_AMBULATORY_CARE_PROVIDER_SITE_OTHER): Payer: No Typology Code available for payment source | Admitting: Internal Medicine

## 2014-03-31 VITALS — BP 122/74 | HR 76 | Temp 98.8°F | Ht 65.5 in | Wt 143.0 lb

## 2014-03-31 DIAGNOSIS — F411 Generalized anxiety disorder: Secondary | ICD-10-CM

## 2014-03-31 DIAGNOSIS — Z23 Encounter for immunization: Secondary | ICD-10-CM

## 2014-03-31 DIAGNOSIS — Z8632 Personal history of gestational diabetes: Secondary | ICD-10-CM

## 2014-03-31 DIAGNOSIS — D179 Benign lipomatous neoplasm, unspecified: Secondary | ICD-10-CM

## 2014-03-31 MED ORDER — FLUOXETINE HCL (PMDD) 10 MG PO TABS: 10.0000 mg | ORAL_TABLET | Freq: Every day | ORAL | Status: DC

## 2014-03-31 NOTE — Assessment & Plan Note (Signed)
Monitor A1c and fasting glucose level.  She exercises on a regular basis.

## 2014-03-31 NOTE — Assessment & Plan Note (Signed)
No change.  Continue to monitor.

## 2014-03-31 NOTE — Progress Notes (Signed)
Subjective:    Patient ID: Mary Berg, female    DOB: [birth year], 38 y.o.   MRN: [id number]  HPI  38 year old white female with history of generalized anxiety disorder for followup. Patient doing quite well since starting fluoxetine 10 mg once daily. She has not experienced any significant change in her weight. She denies any other adverse effects including sexual dysfunction.  Right lower rib lipoma-no change in size or other new symptoms.  Patient still working part-time as an Forensic psychologist.   Review of Systems Negative for weight change or sexual dysfunction.  She has hx of gestational diabetes.    Past Medical History  Diagnosis Date  . Anemia   . Migraine headache   . DM (diabetes mellitus), gestational   . Anxiety     History   Social History  . Marital Status: Married    Spouse Name: N/A    Number of Children: 3  . Years of Education: N/A   Occupational History  . Not on file.   Social History Main Topics  . Smoking status: Former Smoker -- 1.00 packs/day for 10 years    Types: Cigarettes  . Smokeless tobacco: Never Used  . Alcohol Use: 5.4 oz/week    3 Glasses of wine, 3 Cans of beer, 3 Shots of liquor per week     Comment: 10 drinks a week   . Drug Use: No  . Sexual Activity: Yes    Birth Control/ Protection: IUD   Other Topics Concern  . Not on file   Social History Narrative   Daily caffeine    Works park time as Forensic psychologist    Past Surgical History  Procedure Laterality Date  . Tab      history of one uncomplicated TAB in 3794  . Cesarean section      primary uncomplicated in 3276  . Tonsillectomy      Family History  Problem Relation Age of Onset  . Nephrolithiasis    . Arthritis    . Skin cancer    . Hypertension    . Coronary artery disease      no premature in immediate family  . Colon cancer Neg Hx   . Colon polyps Mother   . Breast cancer Paternal Aunt   . Diabetes Paternal Aunt   . Heart disease Paternal Grandfather      Allergies  Allergen Reactions  . Erythromycin     REACTION: sensitive stomach    Current Outpatient Prescriptions on File Prior to Visit  Medication Sig Dispense Refill  . ALPRAZolam (XANAX) 0.25 MG tablet Take 1 tablet (0.25 mg total) by mouth daily as needed for anxiety.  30 tablet  0  . Ibuprofen (MOTRIN IB PO) Take by mouth as needed.      . vitamin C (ASCORBIC ACID) 500 MG tablet Take 500 mg by mouth daily.       No current facility-administered medications on file prior to visit.    BP 122/74  Pulse 76  Temp(Src) 98.8 F (37.1 C) (Oral)  Ht 5' 5.5" (1.664 m)  Wt 143 lb (64.864 kg)  BMI 23.43 kg/m2    Objective:   Physical Exam  Constitutional: She is oriented to person, place, and time. She appears well-developed and well-nourished. No distress.  Cardiovascular: Normal rate, regular rhythm and normal heart sounds.   No murmur heard. Pulmonary/Chest: Effort normal and breath sounds normal. She has no wheezes.  Neurological: She is alert and oriented to person, place, and  time. No cranial nerve deficit.  Psychiatric: She has a normal mood and affect. Her behavior is normal.          Assessment & Plan:

## 2014-03-31 NOTE — Assessment & Plan Note (Signed)
Doing very well on fluoxetine 10 mg.  No significant weight gain or other adverse effect.

## 2014-03-31 NOTE — Progress Notes (Signed)
Pre visit review using our clinic review tool, if applicable. No additional management support is needed unless otherwise documented below in the visit note. 

## 2014-03-31 NOTE — Patient Instructions (Addendum)
Please complete the following lab tests before your next follow up appointment: CPX labs including A1c - use V70 and O99.810

## 2014-04-30 ENCOUNTER — Other Ambulatory Visit (INDEPENDENT_AMBULATORY_CARE_PROVIDER_SITE_OTHER): Payer: No Typology Code available for payment source

## 2014-04-30 DIAGNOSIS — Z0000 Encounter for general adult medical examination without abnormal findings: Secondary | ICD-10-CM

## 2014-04-30 LAB — CBC WITH DIFFERENTIAL/PLATELET
BASOS ABS: 0 10*3/uL (ref 0.0–0.1)
Basophils Relative: 0.9 % (ref 0.0–3.0)
Eosinophils Absolute: 0.4 10*3/uL (ref 0.0–0.7)
Eosinophils Relative: 7.5 % — ABNORMAL HIGH (ref 0.0–5.0)
HCT: 39 % (ref 36.0–46.0)
Hemoglobin: 12.9 g/dL (ref 12.0–15.0)
LYMPHS ABS: 1.8 10*3/uL (ref 0.7–4.0)
Lymphocytes Relative: 31.7 % (ref 12.0–46.0)
MCHC: 33 g/dL (ref 30.0–36.0)
MCV: 90.6 fl (ref 78.0–100.0)
MONOS PCT: 7.1 % (ref 3.0–12.0)
Monocytes Absolute: 0.4 10*3/uL (ref 0.1–1.0)
Neutro Abs: 2.9 10*3/uL (ref 1.4–7.7)
Neutrophils Relative %: 52.8 % (ref 43.0–77.0)
PLATELETS: 265 10*3/uL (ref 150.0–400.0)
RBC: 4.31 Mil/uL (ref 3.87–5.11)
RDW: 13.1 % (ref 11.5–15.5)
WBC: 5.6 10*3/uL (ref 4.0–10.5)

## 2014-04-30 LAB — LIPID PANEL
CHOL/HDL RATIO: 2
CHOLESTEROL: 179 mg/dL (ref 0–200)
HDL: 94.2 mg/dL (ref >39.00)
LDL CALC: 75 mg/dL (ref 0–99)
NONHDL: 84.8
Triglycerides: 51 mg/dL (ref 0.0–149.0)
VLDL: 10.2 mg/dL (ref 0.0–40.0)

## 2014-04-30 LAB — POCT URINALYSIS DIPSTICK
Bilirubin, UA: NEGATIVE
Blood, UA: NEGATIVE
Glucose, UA: NEGATIVE
Ketones, UA: NEGATIVE
Leukocytes, UA: NEGATIVE
Nitrite, UA: NEGATIVE
PH UA: 7.5
SPEC GRAV UA: 1.015
UROBILINOGEN UA: 0.2

## 2014-04-30 LAB — BASIC METABOLIC PANEL
BUN: 14 mg/dL (ref 6–23)
CALCIUM: 9.5 mg/dL (ref 8.4–10.5)
CO2: 27 mEq/L (ref 19–32)
Chloride: 104 mEq/L (ref 96–112)
Creatinine, Ser: 0.8 mg/dL (ref 0.4–1.2)
GFR: 81.32 mL/min (ref >60.00)
Glucose, Bld: 89 mg/dL (ref 70–99)
Potassium: 4.8 mEq/L (ref 3.5–5.1)
Sodium: 138 mEq/L (ref 135–145)

## 2014-04-30 LAB — HEPATIC FUNCTION PANEL
ALT: 14 U/L (ref 0–35)
AST: 20 U/L (ref 0–37)
Albumin: 4.4 g/dL (ref 3.5–5.2)
Alkaline Phosphatase: 53 U/L (ref 39–117)
BILIRUBIN TOTAL: 0.8 mg/dL (ref 0.2–1.2)
Bilirubin, Direct: 0.1 mg/dL (ref 0.0–0.3)
Total Protein: 6.6 g/dL (ref 6.0–8.3)

## 2014-04-30 LAB — TSH: TSH: 2.12 u[IU]/mL (ref 0.35–4.50)

## 2014-05-14 ENCOUNTER — Encounter: Payer: No Typology Code available for payment source | Admitting: Internal Medicine

## 2014-05-24 ENCOUNTER — Encounter: Payer: Self-pay | Admitting: Internal Medicine

## 2014-05-24 ENCOUNTER — Ambulatory Visit (INDEPENDENT_AMBULATORY_CARE_PROVIDER_SITE_OTHER): Payer: No Typology Code available for payment source | Admitting: Internal Medicine

## 2014-05-24 VITALS — BP 110/70 | HR 68 | Temp 98.6°F | Ht 65.0 in | Wt 145.0 lb

## 2014-05-24 DIAGNOSIS — Z0000 Encounter for general adult medical examination without abnormal findings: Secondary | ICD-10-CM

## 2014-05-24 NOTE — Progress Notes (Signed)
Subjective:    Patient ID: Mary Berg, female    DOB: 04-Oct-1975, 38 y.o.   MRN: [id number]  HPI  38 year old white female with history of mild anxiety disorder and anemia for routine physical.   Patient denies any significant interval medical history. Screening labs reviewed with patient. She has history of gestational diabetes. She exercises on a regular basis. Her blood sugar is normal.  Social and family history reviewed and updated.   Review of Systems  Constitutional: Negative for activity change, appetite change and unexpected weight change.  Eyes: Negative for visual disturbance.  Respiratory: Negative for cough, chest tightness and shortness of breath.   Cardiovascular: Negative for chest pain.  Genitourinary: Negative for difficulty urinating.  Neurological: Negative for headaches.  Gastrointestinal: Negative for abdominal pain, heartburn melena or hematochezia Psych: Negative for depression or anxiety Endo:  No polyuria or polydypsia        Past Medical History  Diagnosis Date  . Anemia   . Migraine headache   . DM (diabetes mellitus), gestational   . Anxiety     History   Social History  . Marital Status: Married    Spouse Name: N/A    Number of Children: 3  . Years of Education: N/A   Occupational History  . Attorney    Social History Main Topics  . Smoking status: Former Smoker -- 1.00 packs/day for 10 years    Types: Cigarettes  . Smokeless tobacco: Never Used  . Alcohol Use: 5.4 oz/week    3 Glasses of wine, 3 Cans of beer, 3 Shots of liquor per week     Comment: 10 drinks a week   . Drug Use: No  . Sexual Activity: Yes    Birth Control/ Protection: IUD   Other Topics Concern  . 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 Tab      history of one uncomplicated TAB in 8657  . Cesarean section     primary uncomplicated in 8469  . Tonsillectomy      Family History  Problem Relation Age of Onset  . Nephrolithiasis    . Arthritis    . Skin cancer Maternal Grandmother     Squamous cell  . Hypertension    . Coronary artery disease      no premature in immediate family  . Colon cancer Neg Hx   . Colon polyps Mother   . Breast cancer Paternal Aunt   . Diabetes Paternal Aunt   . Heart disease Paternal Grandfather   . Vaginal cancer Paternal Aunt     Allergies  Allergen Reactions  . Erythromycin     REACTION: sensitive stomach    Current Outpatient Prescriptions on File Prior to Visit  Medication Sig Dispense Refill  . ALPRAZolam (XANAX) 0.25 MG tablet Take 1 tablet (0.25 mg total) by mouth daily as needed for anxiety. 30 tablet 0  . Fluoxetine HCl, PMDD, (SARAFEM) 10 MG TABS Take 1 tablet (10 mg total) by mouth daily. 90 tablet 3  . Ibuprofen (MOTRIN IB PO) Take by mouth as needed.    . vitamin C (ASCORBIC ACID) 500 MG tablet Take 500 mg by mouth daily.     No current facility-administered medications on file prior to visit.    BP 110/70 mmHg  Pulse 68  Temp(Src) 98.6 F (37 C) (Oral)  Ht 5\' 5"  (1.651 m)  Wt 145 lb (65.772 kg)  BMI 24.13 kg/m2      Objective:   Physical Exam  Constitutional: She is oriented to person, place, and time. She appears well-developed and well-nourished. No distress.  HENT:  Head: Normocephalic and atraumatic.  Right Ear: External ear normal.  Left Ear: External ear normal.  Mouth/Throat: Oropharynx is clear and moist.  Eyes: Conjunctivae and EOM are normal. Pupils are equal, round, and reactive to light. No scleral icterus.  Neck: Normal range of motion. Neck supple.  Cardiovascular: Normal rate, regular rhythm and normal heart sounds.   Pulmonary/Chest: Effort normal and breath sounds normal. She has no wheezes.  Soft tissue mass (lipoma) right lower ribs. Oval in shape 2 cm x 4 cm, non tender  Abdominal: Soft. Bowel sounds are  normal. There is no tenderness.  Musculoskeletal: She exhibits no edema.  Lymphadenopathy:    She has no cervical adenopathy.  Neurological: She is alert and oriented to person, place, and time. No cranial nerve deficit.  Skin: Skin is warm.  Psychiatric: She has a normal mood and affect. Her behavior is normal.          Assessment & Plan:

## 2014-05-24 NOTE — Progress Notes (Signed)
Pre visit review using our clinic review tool, if applicable. No additional management support is needed unless otherwise documented below in the visit note. 

## 2014-05-24 NOTE — Assessment & Plan Note (Signed)
Reviewed adult health maintenance protocols.  Patient followed by gynecologist for routine Pap and pelvic exam. Baseline mammogram to start at age 38.  Patient up-to-date with adult vaccines.  Patient exercises on a regular basis. Screening labs are unremarkable.  Right lower rib lipoma slightly larger than previous exam.   Continue to monitor for now.  It is asymptomatic.

## 2015-05-24 ENCOUNTER — Ambulatory Visit (INDEPENDENT_AMBULATORY_CARE_PROVIDER_SITE_OTHER): Payer: PRIVATE HEALTH INSURANCE | Admitting: Physician Assistant

## 2015-05-24 VITALS — BP 122/76 | HR 82 | Temp 98.2°F | Resp 18 | Ht 65.0 in | Wt 151.4 lb

## 2015-05-24 DIAGNOSIS — J069 Acute upper respiratory infection, unspecified: Secondary | ICD-10-CM

## 2015-05-24 MED ORDER — IPRATROPIUM BROMIDE 0.03 % NA SOLN: 2.0000 | Freq: Two times a day (BID) | NASAL | Status: DC

## 2015-05-24 MED ORDER — AZITHROMYCIN 250 MG PO TABS: ORAL_TABLET | ORAL | Status: DC

## 2015-05-24 MED ORDER — GUAIFENESIN ER 1200 MG PO TB12: 1.0000 | ORAL_TABLET | Freq: Two times a day (BID) | ORAL | Status: DC | PRN

## 2015-05-24 NOTE — Patient Instructions (Signed)
Please hydrate well. You can use 5ML of the cough syrup at night. If you develop shortness of breath, trouble breathing, uncontrolled breathing, we should see you back here.

## 2015-05-24 NOTE — Progress Notes (Signed)
[facility] [address] [place] [address] [phone]  Date:  05/24/2015   Name:  Mary Berg   DOB:  1976/04/30   MRN:  [id number]  PCP:  Drema Pry, DO    History of Present Illness:  Mary Berg is a 39 y.o. female patient who presents to [facility] for cc of cough, sore throat, and congestion for 2 weeks. Patient has had intermittent productive cough.  She initially had sore throat that has since resolved.  Rhinorrhea is intermittently "chunky" and thick in the morning, and thin and clear at night.  No fever, but feels fatigued.  Does not think she has some sob, but feels she has trouble breathing secondary to cough and nasal congestion, that makes her lose her breath.  Mother in law with URI.  Children with what seemed to be a virus that cleared.   She has fatigue.  Patient Active Problem List   Diagnosis Date Noted  . Preventative health care 04/24/2013  . Lipoma 04/24/2013  . Generalized anxiety disorder 04/24/2013  . Chronic LLQ pain 06/20/2011  . BACK PAIN, THORACIC REGION 06/13/2010  . PALPITATIONS 05/10/2010  . ABNORMAL ELECTROCARDIOGRAM 05/10/2010  . MIGRAINE HEADACHE 05/08/2010  . CARDIAC ARRHYTHMIA 05/08/2010  . Hx gestational diabetes 05/08/2010  . CERVICAL LYMPHADENOPATHY 05/08/2010    Past Medical History  Diagnosis Date  . Anemia   . Migraine headache   . DM (diabetes mellitus), gestational   . Anxiety     Past Surgical History  Procedure Laterality Date  . Tab      history of one uncomplicated TAB in [id number]  . Cesarean section      primary uncomplicated in [id number]  . Tonsillectomy      Social History  Substance Use Topics  . Smoking status: Former Smoker -- 1.00 packs/day for 10 years    Types: Cigarettes  . Smokeless tobacco: Never Used  . Alcohol Use: 5.4 oz/week    3 Glasses of wine, 3 Cans of beer, 3 Shots of liquor per week     Comment: 10 drinks a week     Family History  Problem Relation Age of Onset  .  Nephrolithiasis    . Arthritis    . Skin cancer Maternal Grandmother     Squamous cell  . Hypertension    . Coronary artery disease      no premature in immediate family  . Colon cancer Neg Hx   . Colon polyps Mother   . Breast cancer Paternal Aunt   . Diabetes Paternal Aunt   . Heart disease Paternal Grandfather   . Vaginal cancer Paternal Aunt     Allergies  Allergen Reactions  . Erythromycin     REACTION: sensitive stomach    Medication list has been reviewed and updated.  Current Outpatient Prescriptions on File Prior to Visit  Medication Sig Dispense Refill  . Ibuprofen (MOTRIN IB PO) Take by mouth as needed.    . vitamin C (ASCORBIC ACID) 500 MG tablet Take 500 mg by mouth daily.    Marland Kitchen ALPRAZolam (XANAX) 0.25 MG tablet Take 1 tablet (0.25 mg total) by mouth daily as needed for anxiety. (Patient not taking: Reported on 05/24/2015) 30 tablet 0  . Fluoxetine HCl, PMDD, (SARAFEM) 10 MG TABS Take 1 tablet (10 mg total) by mouth daily. (Patient not taking: Reported on 05/24/2015) 90 tablet 3   No current facility-administered medications on file prior to visit.    ROS ROS  otherwise unremarkable unremarkable unless listed above.  Physical Examination: BP 122/76 mmHg  Pulse 82  Temp(Src) 98.2 F (36.8 C) (Oral)  Resp 18  Ht 5\' 5"  (1.651 m)  Wt 151 lb 6.4 oz (68.675 kg)  BMI 25.19 kg/m2  SpO2 99%  LMP 05/03/2015 Ideal Body Weight: Weight in (lb) to have BMI = 25: 149.9  Physical Exam  Constitutional: She is oriented to person, place, and time. She appears well-developed and well-nourished. No distress.  HENT:  Head: Normocephalic and atraumatic.  Right Ear: Tympanic membrane, external ear and ear canal normal.  Left Ear: Tympanic membrane, external ear and ear canal normal.  Nose: Mucosal edema and rhinorrhea (right with heavy mucopurulence) present. Right sinus exhibits no maxillary sinus tenderness and no frontal sinus tenderness. Left sinus exhibits no maxillary  sinus tenderness and no frontal sinus tenderness.  Mouth/Throat: No uvula swelling. No oropharyngeal exudate, posterior oropharyngeal edema or posterior oropharyngeal erythema.  Eyes: Conjunctivae and EOM are normal. Pupils are equal, round, and reactive to light.  Cardiovascular: Normal rate and regular rhythm.  Exam reveals no gallop, no distant heart sounds and no friction rub.   No murmur heard. Pulses:      Radial pulses are 2+ on the right side, and 2+ on the left side.       Dorsalis pedis pulses are 2+ on the right side, and 2+ on the left side.  Pulmonary/Chest: Effort normal. No respiratory distress. She has no decreased breath sounds. She has no wheezes. She has rhonchi.  Lymphadenopathy:       Head (right side): No submandibular, no tonsillar, no preauricular and no posterior auricular adenopathy present.       Head (left side): No submandibular, no tonsillar, no preauricular and no posterior auricular adenopathy present.    She has no cervical adenopathy.  Neurological: She is alert and oriented to person, place, and time.  Skin: She is not diaphoretic.  Psychiatric: She has a normal mood and affect. Her behavior is normal.     Assessment and Plan: Mary Berg is a 39 y.o. female who is here today nasal congestion, cough, and headache for more than 2 weeks. Treating with azithromycin, advised supportive treatment.  Declines medication for cough at this time.   Acute upper respiratory infection - Plan: azithromycin (ZITHROMAX) 250 MG tablet, ipratropium (ATROVENT) 0.03 % nasal spray, Guaifenesin (MUCINEX MAXIMUM STRENGTH) 1200 MG TB12   Ivar Drape, PA-C [facility] 05/24/2015 1:54 PM

## 2015-05-25 ENCOUNTER — Encounter (HOSPITAL_COMMUNITY): Payer: Self-pay | Admitting: Emergency Medicine

## 2015-05-25 ENCOUNTER — Emergency Department (HOSPITAL_COMMUNITY)
Admission: EM | Admit: 2015-05-25 | Discharge: 2015-05-26 | Disposition: A | Discharge status: Home / Self Care | Payer: No Typology Code available for payment source | Attending: Emergency Medicine | Admitting: Emergency Medicine

## 2015-05-25 ENCOUNTER — Telehealth: Payer: Self-pay

## 2015-05-25 DIAGNOSIS — Z8632 Personal history of gestational diabetes: Secondary | ICD-10-CM | POA: Insufficient documentation

## 2015-05-25 DIAGNOSIS — L509 Urticaria, unspecified: Secondary | ICD-10-CM | POA: Insufficient documentation

## 2015-05-25 DIAGNOSIS — R21 Rash and other nonspecific skin eruption: Secondary | ICD-10-CM | POA: Diagnosis present

## 2015-05-25 DIAGNOSIS — Z79899 Other long term (current) drug therapy: Secondary | ICD-10-CM | POA: Diagnosis not present

## 2015-05-25 DIAGNOSIS — Z8659 Personal history of other mental and behavioral disorders: Secondary | ICD-10-CM | POA: Insufficient documentation

## 2015-05-25 DIAGNOSIS — Z862 Personal history of diseases of the blood and blood-forming organs and certain disorders involving the immune mechanism: Secondary | ICD-10-CM | POA: Insufficient documentation

## 2015-05-25 DIAGNOSIS — Z8679 Personal history of other diseases of the circulatory system: Secondary | ICD-10-CM | POA: Insufficient documentation

## 2015-05-25 DIAGNOSIS — Z87891 Personal history of nicotine dependence: Secondary | ICD-10-CM | POA: Diagnosis not present

## 2015-05-25 MED ORDER — FAMOTIDINE IN NACL 20-0.9 MG/50ML-% IV SOLN
20.0000 mg | Freq: Once | INTRAVENOUS | Status: AC
  Administered 2015-05-25: 20 mg via INTRAVENOUS
  Filled 2015-05-25: qty 50

## 2015-05-25 MED ORDER — METHYLPREDNISOLONE SODIUM SUCC 125 MG IJ SOLR
125.0000 mg | Freq: Once | INTRAMUSCULAR | Status: AC
  Administered 2015-05-25: 125 mg via INTRAVENOUS
  Filled 2015-05-25: qty 2

## 2015-05-25 MED ORDER — SODIUM CHLORIDE 0.9 % IV BOLUS (SEPSIS)
1000.0000 mL | Freq: Once | INTRAVENOUS | Status: AC
  Administered 2015-05-25: 1000 mL via INTRAVENOUS

## 2015-05-25 MED ORDER — HYDROXYZINE HCL 25 MG PO TABS
25.0000 mg | ORAL_TABLET | Freq: Once | ORAL | Status: AC
  Administered 2015-05-25: 25 mg via ORAL
  Filled 2015-05-25: qty 1

## 2015-05-25 NOTE — ED Notes (Addendum)
Patient has been breaking out in hives since 14:30 this afternoon. She took 2 Benadryl earlier today with some relief. States around 19:30 the Benadryl began to wear off, she took two more, but since has felt like her throat is slowly beginning to swell. Pt states she's okay with breathing but it is starting to get difficult to swallow. No obvious visible oral swelling towards posterior mouth.   Believes its an allergy to the Azithromycin she began taking yesterday

## 2015-05-25 NOTE — Telephone Encounter (Signed)
Pt states she was given a Z-PAK and have broken out in hives, would like to have something else called in. Please call [place]

## 2015-05-25 NOTE — Progress Notes (Signed)
  Medical screening examination/treatment/procedure(s) were performed by non-physician practitioner and as supervising physician I was immediately available for consultation/collaboration.     

## 2015-05-25 NOTE — ED Provider Notes (Signed)
CSN: [id number]     Arrival date & time 05/25/15  2157 History   First MD Initiated Contact with Patient 05/25/15 2213     No chief complaint on file.    (Consider location/radiation/quality/duration/timing/severity/associated sxs/prior Treatment) HPI   39 year old female with history of diabetes, anxiety, presenting for evaluation of pruritic hives. Patient states she has had URI symptoms for the past 2 weeks and was recently prescribed azithromycin. She began taking 2 pills yesterday and one this morning. Around noontime she developed itchy hives throughout her body. She took 2 Benadryl with moderate improvement however tonight the haves has returns. Furthermore, she feels a sensation of throat swelling which concerns her. She denies having fever, chills, headache, wheezing, shortness of breath, abdominal cramping, nausea vomiting. Aside from azithromycin she was prescribed Mucinex, and nasal spray which she has had in the past. She denies any other food environmental or medication changes.  Past Medical History  Diagnosis Date  . Anemia   . Migraine headache   . DM (diabetes mellitus), gestational   . Anxiety    Past Surgical History  Procedure Laterality Date  . Tab      history of one uncomplicated TAB in [id number]  . Cesarean section      primary uncomplicated in [id number]  . Tonsillectomy     Family History  Problem Relation Age of Onset  . Nephrolithiasis    . Arthritis    . Skin cancer Maternal Grandmother     Squamous cell  . Hypertension    . Coronary artery disease      no premature in immediate family  . Colon cancer Neg Hx   . Colon polyps Mother   . Breast cancer Paternal Aunt   . Diabetes Paternal Aunt   . Heart disease Paternal Grandfather   . Vaginal cancer Paternal Aunt    Social History  Substance Use Topics  . Smoking status: Former Smoker -- 1.00 packs/day for 10 years    Types: Cigarettes  . Smokeless tobacco: Never Used  . Alcohol Use: 5.4 oz/week    3  Glasses of wine, 3 Cans of beer, 3 Shots of liquor per week     Comment: 10 drinks a week    OB History    No data available     Review of Systems  All other systems reviewed and are negative.     Allergies  Azithromycin and Erythromycin  Home Medications   Prior to Admission medications   Medication Sig Start Date End Date Taking? Authorizing Provider  azithromycin (ZITHROMAX) 250 MG tablet Take 2 tabs PO x 1 dose, then 1 tab PO QD x 4 days 05/24/15  Yes Stephanie D English, PA  Guaifenesin (MUCINEX MAXIMUM STRENGTH) 1200 MG TB12 Take 1 tablet (1,200 mg total) by mouth every 12 (twelve) hours as needed. 05/24/15  Yes Stephanie D English, PA  Ibuprofen (MOTRIN IB PO) Take 200 mg by mouth as needed (pain).    Yes Historical Provider, MD  ipratropium (ATROVENT) 0.03 % nasal spray Place 2 sprays into both nostrils 2 (two) times daily. 05/24/15  Yes Dorian Heckle English, PA  ALPRAZolam (XANAX) 0.25 MG tablet Take 1 tablet (0.25 mg total) by mouth daily as needed for anxiety. Patient not taking: Reported on 05/24/2015 04/24/13   Doe-Hyun R Shawna Orleans, DO  Fluoxetine HCl, PMDD, (SARAFEM) 10 MG TABS Take 1 tablet (10 mg total) by mouth daily. Patient not taking: Reported on 05/24/2015 03/31/14   Doe-Hyun R Shawna Orleans, DO  BP 139/85 mmHg  Pulse 72  Temp(Src) 97.4 F (36.3 C) (Oral)  Resp 18  SpO2 100%  LMP 05/03/2015 Physical Exam  Constitutional: She appears well-developed and well-nourished. No distress.  HENT:  Head: Atraumatic.  Mouth/Throat: Oropharynx is clear and moist.  Eyes: Conjunctivae are normal.  Neck: Neck supple.  Cardiovascular: Normal rate and regular rhythm.   Pulmonary/Chest: Effort normal and breath sounds normal. She has no wheezes.  Abdominal: Soft. There is no tenderness.  Musculoskeletal: She exhibits no edema.  Neurological: She is alert.  Skin: Rash (pruritic hives throughout body) noted.  Psychiatric: She has a normal mood and affect.  Nursing note and vitals  reviewed.   ED Course  Procedures (including critical care time)   MDM   Final diagnoses:  Hives    BP 139/85 mmHg  Pulse 72  Temp(Src) 97.4 F (36.3 C) (Oral)  Resp 18  SpO2 100%  LMP 05/03/2015   10:28 PM Patient presents with pruritic hives likely secondary to recent exposure to azithromycin. She mentioned some throat discomfort, she has an intact airway, no active wheezing, no abdominal cramping, and no other anaphylactic symptoms. Her blood pressure is stable and she is mentating appropriately. Therefore, patient will receive IV fluids, Solu-Medrol, Vistaril, and Pepcid.  Pt last took benadryl 2 hrs ago.    12:18 AM Pt felt better after receiving treatment.  Urticarial hives improves but not fully resolved.  Pt felt comfortable going home.  D/C with prednisone/benadryl/pepcid.  Pt to avoid azithromycin and f/u with PCP.  Return precaution discussed.    Domenic Moras, PA-C 05/26/15 0020  Charlesetta Shanks, MD 05/30/15 [phone]

## 2015-05-26 MED ORDER — FAMOTIDINE 20 MG PO TABS: 20.0000 mg | ORAL_TABLET | Freq: Two times a day (BID) | ORAL | Status: DC

## 2015-05-26 MED ORDER — PREDNISONE 20 MG PO TABS: ORAL_TABLET | ORAL | Status: DC

## 2015-05-26 MED ORDER — DIPHENHYDRAMINE HCL 25 MG PO TABS: 25.0000 mg | ORAL_TABLET | Freq: Four times a day (QID) | ORAL | Status: AC | PRN

## 2015-05-26 NOTE — Discharge Instructions (Signed)
Hives Hives are itchy, red, swollen areas of the skin. They can vary in size and location on your body. Hives can come and go for hours or several days (acute hives) or for several weeks (chronic hives). Hives do not spread from person to person (noncontagious). They may get worse with scratching, exercise, and emotional stress. CAUSES   Allergic reaction to food, additives, or drugs.  Infections, including the common cold.  Illness, such as vasculitis, lupus, or thyroid disease.  Exposure to sunlight, heat, or cold.  Exercise.  Stress.  Contact with chemicals. SYMPTOMS   Red or white swollen patches on the skin. The patches may change size, shape, and location quickly and repeatedly.  Itching.  Swelling of the hands, feet, and face. This may occur if hives develop deeper in the skin. DIAGNOSIS  Your caregiver can usually tell what is wrong by performing a physical exam. Skin or blood tests may also be done to determine the cause of your hives. In some cases, the cause cannot be determined. TREATMENT  Mild cases usually get better with medicines such as antihistamines. Severe cases may require an emergency epinephrine injection. If the cause of your hives is known, treatment includes avoiding that trigger.  HOME CARE INSTRUCTIONS   Avoid causes that trigger your hives.  Take antihistamines as directed by your caregiver to reduce the severity of your hives. Non-sedating or low-sedating antihistamines are usually recommended. Do not drive while taking an antihistamine.  Take any other medicines prescribed for itching as directed by your caregiver.  Wear loose-fitting clothing.  Keep all follow-up appointments as directed by your caregiver. SEEK MEDICAL CARE IF:   You have persistent or severe itching that is not relieved with medicine.  You have painful or swollen joints. SEEK IMMEDIATE MEDICAL CARE IF:   You have a fever.  Your tongue or lips are swollen.  You have  trouble breathing or swallowing.  You feel tightness in the throat or chest.  You have abdominal pain. These problems may be the first sign of a life-threatening allergic reaction. Call your local emergency services (911 in [place] MAKE SURE YOU:   Understand these instructions.  Will watch your condition.  Will get help right away if you are not doing well or get worse.   This information is not intended to replace advice given to you by your health care provider. Make sure you discuss any questions you have with your health care provider.   Document Released: 05/28/2005 Document Revised: 06/02/2013 Document Reviewed: 08/21/2011 Elsevier Interactive Patient Education 2016 [employer] Inc[employer]  

## 2015-05-27 NOTE — Telephone Encounter (Signed)
Pt was seen in the ED.  

## 2015-07-13 ENCOUNTER — Other Ambulatory Visit: Payer: Self-pay

## 2015-07-20 ENCOUNTER — Encounter: Payer: Self-pay | Admitting: Internal Medicine

## 2016-05-21 ENCOUNTER — Encounter: Payer: Self-pay | Admitting: *Deleted

## 2016-06-21 ENCOUNTER — Encounter: Payer: Self-pay | Admitting: Internal Medicine

## 2016-08-03 ENCOUNTER — Ambulatory Visit (INDEPENDENT_AMBULATORY_CARE_PROVIDER_SITE_OTHER): Payer: PRIVATE HEALTH INSURANCE | Admitting: Family Medicine

## 2016-08-03 ENCOUNTER — Encounter: Payer: Self-pay | Admitting: Family Medicine

## 2016-08-03 VITALS — BP 118/70 | HR 83 | Resp 12 | Ht 65.0 in | Wt 143.4 lb

## 2016-08-03 DIAGNOSIS — F411 Generalized anxiety disorder: Secondary | ICD-10-CM | POA: Diagnosis not present

## 2016-08-03 DIAGNOSIS — G8929 Other chronic pain: Secondary | ICD-10-CM

## 2016-08-03 DIAGNOSIS — M549 Dorsalgia, unspecified: Secondary | ICD-10-CM

## 2016-08-03 DIAGNOSIS — Z23 Encounter for immunization: Secondary | ICD-10-CM | POA: Diagnosis not present

## 2016-08-03 MED ORDER — METHOCARBAMOL 500 MG PO TABS: 500.0000 mg | ORAL_TABLET | Freq: Three times a day (TID) | ORAL | 1 refills | Status: DC

## 2016-08-03 MED ORDER — FLUOXETINE HCL 10 MG PO CAPS: 10.0000 mg | ORAL_CAPSULE | Freq: Every day | ORAL | 1 refills | Status: DC

## 2016-08-03 MED ORDER — ALPRAZOLAM 0.25 MG PO TABS: 0.2500 mg | ORAL_TABLET | Freq: Every day | ORAL | 0 refills | Status: DC | PRN

## 2016-08-03 NOTE — Progress Notes (Signed)
Pre visit review using our clinic review tool, if applicable. No additional management support is needed unless otherwise documented below in the visit note. 

## 2016-08-03 NOTE — Progress Notes (Signed)
HPI:   [name] is a 41 y.o. female, who is here today to establish care with me.  Former PCP: Dr Shawna Orleans. Last preventive routine visit: She follows with gyn periodically.   Chronic medical problems: Anxiety, chronic back pain, colo polyps (had colonoscopy a few years ago),and abdominal pain among some.    Concerns today: Worsening anxiety.  She is reporting Hx of somatization disorder, "hypocondriac." She has Hx of upper back pain, which states is not so bad but she "always" thinks the worse, afraid of having cancer. She tells me that she knows she does not but sometimes she cannot stop thinking about having a serious disease.  She was on Prozac before, took medications for about 2 years, discontinued in 2016 because she thought she was doing better.She also took Wellbutrin, prescribed by her gyn, she wean it off because felt "jittery" and "panicly" while she was taking it.  She noted that medication really help and would like to start it again.  She also received a Rx for Xanax, which she never started but wonders if she can have a Rx.She has had 4 panic attacks this week, feels like anxiety is getting worse. + Stress related with work. She states that she does not have a reason to be depressed or anxious, she has a "happy life." She denies suicidal thoughts. 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  She lives with her husband and their 3 boys. Her 21 yo son also has Hx of anxiety.  Yoga and meditation help.  Back pain:  She has had pain for years now, it is usually cervical and upper thoracic, "tension" sensation. She thinks yoga might exacerbate pain, she tells me she does "high power" yoga a few times per week.   About 2 weeks ago while she was doing one of the yoga poses in class, she lost balance and fell. Since then she has had persistent pain. She states that "objectively pain is not bad" but she feels like it  is and worried about being caused by "lung cancer" Pain is intermittent,no radiated, interscapular, L>R.  She denies numbness,tingling,or burning. Ibuprofen 600 mg and local massage help. Certain activities exacerbate pain. No limitation of ROM.  She has had similar pain in the past and PT really helped.  No associated cough,wheezing,dyspena,skin lesions,or abnormal wt loss.  Former smoker.   Review of Systems  Constitutional: Positive for fatigue (no kore than usual). Negative for appetite change, fever and unexpected weight change.  HENT: Negative for mouth sores, sore throat, trouble swallowing and voice change.   Respiratory: Negative for cough, shortness of breath and wheezing.   Cardiovascular: Negative for chest pain, palpitations and leg swelling.  Gastrointestinal: Positive for abdominal pain (occasional LLQ,chronic). Negative for blood in stool, nausea and vomiting.       No changes in bowel habits.  Endocrine: Negative for polydipsia, polyphagia and polyuria.  Genitourinary: Negative for decreased urine volume and hematuria.  Musculoskeletal: Positive for back pain. Negative for arthralgias, gait problem, joint swelling and neck pain.  Skin: Negative for color change and rash.  Neurological: Negative for syncope, weakness, numbness and headaches.  Hematological: Negative for adenopathy. Does not bruise/bleed easily.  Psychiatric/Behavioral: Negative for confusion and suicidal ideas. The patient is nervous/anxious.       Current Outpatient Prescriptions on File Prior to Visit  Medication Sig Dispense Refill  . diphenhydrAMINE (BENADRYL) 25 MG tablet Take 1  tablet (25 mg total) by mouth every 6 (six) hours as needed for itching or allergies. 20 tablet 0  . Ibuprofen (MOTRIN IB PO) Take 200 mg by mouth as needed (pain).     .       No current facility-administered medications on file prior to visit.      Past Medical History:  Diagnosis Date  . Anemia   . Anxiety    . DM (diabetes mellitus), gestational   . Migraine headache    Allergies  Allergen Reactions  . Azithromycin Anaphylaxis and Hives  . Erythromycin     REACTION: sensitive stomach    Family History  Problem Relation Age of Onset  . Nephrolithiasis    . Arthritis    . Hypertension    . Coronary artery disease      no premature in immediate family  . Skin cancer Maternal Grandmother     Squamous cell  . Colon polyps Mother   . Breast cancer Paternal Aunt   . Diabetes Paternal Aunt   . Heart disease Paternal Grandfather   . Vaginal cancer Paternal Aunt   . Colon cancer Neg Hx     Social History   Social History  . Marital status: Married    Spouse name: N/A  . Number of children: 3  . Years of education: N/A   Occupational History  . Attorney    Social History Main Topics  . Smoking status: Former Smoker    Packs/day: 1.00    Years: 10.00    Types: Cigarettes  . Smokeless tobacco: Never Used  . Alcohol use 5.4 oz/week    3 Glasses of wine, 3 Cans of beer, 3 Shots of liquor per week     Comment: 10 drinks a week   . Drug use: No  . Sexual activity: Yes    Birth control/ protection: IUD   Other Topics Concern  . 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   Physical Exam  Nursing note and vitals reviewed. Constitutional: She is oriented to person, place, and time. She appears well-developed and well-nourished. No distress.  HENT:  Head: Atraumatic.  Mouth/Throat: Oropharynx is clear and moist and mucous membranes are normal.  Eyes: Conjunctivae and EOM are normal. Pupils are equal, round, and reactive to light.  Cardiovascular: Normal rate and regular rhythm.   No murmur heard. Pulses:      Dorsalis pedis pulses are 2+ on the right side, and 2+ on the left side.    Respiratory: Effort normal and breath sounds normal. No respiratory distress.  GI: Soft. She exhibits no mass. There is no hepatomegaly. There is no tenderness.  Musculoskeletal: She exhibits no edema.  No significant deformity appreciated. + Tenderness upon palpation of paraspinal muscles, interscapular L>R, left trapezium. No pain of cervical paraspinal muscles. Shoulders normal ROM, no pain elicited. No local edema or erythema appreciated, no suspicious lesions.  Lymphadenopathy:    She has no cervical adenopathy.  Neurological: She is alert and oriented to person, place, and time. She has normal strength. Coordination normal.  Skin: Skin is warm. No erythema.  Psychiatric: Her mood appears anxious. Her affect is labile. Cognition and memory are normal. She expresses no  suicidal ideation.  Well groomed, good eye contact.      ASSESSMENT AND PLAN:   Mary Berg was seen today for establish care.  Diagnoses and all orders for this visit:  Upper back pain, chronic  Examination and provided Hx do not suggest a serious disease. Still I offered thoracic X ray for reassuring, she feels like we can hold on it. Some side effects of muscle relaxants discussed. PT referral placed. Recommend decreasing intensity and frequency of exercise, injury prevention.   -     Ambulatory referral to Physical Therapy -     methocarbamol (ROBAXIN) 500 MG tablet; Take 1 tablet (500 mg total) by mouth 3 (three) times daily.  Generalized anxiety disorder  Because chronic back pain Cymbalta could be a good options.She is afraid of trying a new medication, prefers Prozac because she took it before and tolerated well.She was taking 10 mg, so will start same dose. It can be increased to 20 mg if well tolerated. Xanax to take if needed for 4-8 weeks. Psychotherapy will also help.  Clearly discussed side effects and educated about warning signs. She will let me know in 4-5 weeks about tolerance. F/U in 6-8  weeks,before if needed.  -     FLUoxetine (PROZAC) 10 MG capsule; Take 1 capsule (10 mg total) by mouth daily. -     ALPRAZolam (XANAX) 0.25 MG tablet; Take 1 tablet (0.25 mg total) by mouth daily as needed for anxiety.   Need for prophylactic vaccination and inoculation against influenza -     Flu Vaccine QUAD 36+ mos IM     Camara Rosander G. Martin[place], MD  [facility]. [place] office.

## 2016-08-03 NOTE — Patient Instructions (Addendum)
A few things to remember from today's visit:   Generalized anxiety disorder - Plan: FLUoxetine (PROZAC) 10 MG capsule, ALPRAZolam (XANAX) 0.25 MG tablet  Upper back pain, chronic - Plan: Ambulatory referral to Physical Therapy, methocarbamol (ROBAXIN) 500 MG tablet    Please be sure medication list is accurate. If a new problem present, please set up appointment sooner than planned today.

## 2016-08-17 ENCOUNTER — Ambulatory Visit: Payer: PRIVATE HEALTH INSURANCE | Attending: Family Medicine | Admitting: Physical Therapy

## 2016-08-17 DIAGNOSIS — M6281 Muscle weakness (generalized): Secondary | ICD-10-CM | POA: Diagnosis present

## 2016-08-17 DIAGNOSIS — M546 Pain in thoracic spine: Secondary | ICD-10-CM | POA: Insufficient documentation

## 2016-08-17 DIAGNOSIS — R293 Abnormal posture: Secondary | ICD-10-CM | POA: Insufficient documentation

## 2016-08-17 NOTE — Patient Instructions (Signed)
Posture - Standing   Good posture is important. Avoid slouching and forward head thrust. Maintain curve in low back and align ears over shoulders, hips over ankles.  Pull your belly button in toward your back bone. Posture Tips DO: - stand tall and erect - keep chin tucked in - keep head and shoulders in alignment - check posture regularly in mirror or large window - pull head back against headrest in car seat;  Change your position often.  Sit with lumbar support. DON'T: - slouch or slump while watching TV or reading - sit, stand or lie in one position  for too long;  Sitting is especially hard on the spine so if you sit at a desk/use the computer, then stand up often! Copyright  [employer]HI. All rights reserved.  Posture - Sitting  Sit upright, head facing forward. Try using a roll to support lower back. Keep shoulders relaxed, and avoid rounded back. Keep hips level with knees. Avoid crossing legs for long periods. Copyright  [employer]. All rights reserved.  Chronic neck strain can develop because of poor posture and faulty work habits  Postural strain related to slumped sitting and forward head posture is a leading cause of headaches, neck and upper back pain  General strengthening and flexibility exercises are helpful in the treatment of neck pain.  Most importantly, you should learn to correct the posture that may be contributing to chronic pain.   Change positions frequently  Change your work or home environment to improve posture and mechanics.    

## 2016-08-17 NOTE — Therapy (Signed)
[facility] [address] [place], [place], [address] Phone: [phone]   Fax:  [phone]  Physical Therapy Evaluation  Patient Details  Name: Mary Berg MRN: [id number] Date of Birth: 13-Jan-1976 Referring Provider: Dr. Martinique  Encounter Date: 08/17/2016      PT End of Session - 08/17/16 1042    Visit Number 1   Date for PT Re-Evaluation 10/12/16   PT Start Time 0932   PT Stop Time 1013   PT Time Calculation (min) 41 min   Activity Tolerance Patient tolerated treatment well      Past Medical History:  Diagnosis Date  . Anemia   . Anxiety   . DM (diabetes mellitus), gestational   . Migraine headache     Past Surgical History:  Procedure Laterality Date  . CESAREAN SECTION     primary uncomplicated in 4034  . TAB     history of one uncomplicated TAB in 7425  . TONSILLECTOMY      There were no vitals filed for this visit.       Subjective Assessment - 08/17/16 0935    Subjective History of upper back pain 10 years ago.  Got help for it 5 years ago.  Periods where it will flare up and the go away.   This episode has been present 3 months.  Wonder if her particular style of yoga which involves jumping, head and hand stands, side planks.  Has felt better recently since she has backed off yoga.  Originally lifting infant carrier into the car bothered.     Pertinent History right hand dom   Limitations House hold activities;Lifting   How long can you sit comfortably? as long as I like but maybe after long day on computer    How long can you walk comfortably? no problem    Diagnostic tests none yet   Patient Stated Goals get back to yoga;  have some ex's to relieve it when it flares up   Currently in Pain? Yes   Pain Score 1    Pain Location Scapula   Pain Orientation Left   Pain Type Chronic pain   Pain Onset More than a month ago   Pain Frequency Intermittent   Aggravating Factors  yoga; lifting;  stress/anxiety    Pain Relieving Factors heat;  some stretches, massage;  tennis ball            [facility] PT Assessment - 08/17/16 0001      Assessment   Medical Diagnosis upper back pain   Referring Provider Dr. Martinique   Onset Date/Surgical Date --  3 months   Hand Dominance Right   Next MD Visit end of April   Prior Therapy 5 years ago same problem      Precautions   Precautions None     Restrictions   Weight Bearing Restrictions No     Balance Screen   Has the patient fallen in the past 6 months Yes   How many times? 1  during yoga   Has the patient had a decrease in activity level because of a fear of falling?  No   Is the patient reluctant to leave their home because of a fear of falling?  No     Home Environment   Living Environment Private residence   Living Arrangements Spouse/significant other;Children   Type of [place]     Prior Function   Level of [place]  husband carries  laundry   Vocation Full time employment   Vocation Requirements sitting   Leisure hike; camp; yoga     Observation/Other Assessments   Focus on Therapeutic Outcomes (FOTO)  32% limitation     Posture/Postural Control   Posture/Postural Control Postural limitations   Postural Limitations Rounded Shoulders;Forward head;Increased thoracic kyphosis   Posture Comments left scapular winging;  asymmetrical left scapular elevation and protraction with overhead     AROM   AROM Assessment Site Lumbar   Cervical Flexion WFLs   Cervical Extension WFLs   Cervical - Right Side Bend 35   Cervical - Left Side Bend 45   Cervical - Right Rotation 40   Cervical - Left Rotation 62   Lumbar Flexion WFLs   Lumbar Extension WFLs   Lumbar - Right Side Bend WFLS   Lumbar - Left Side Bend WFLs   Thoracic Flexion WFLs   Thoracic Extension 50% limited     Strength   Right/Left Shoulder Left  middle and lower trap strength 4-/5   Cervical Flexion 4/5   Cervical Extension 4/5      Palpation   Palpation comment Tender points left > right upper traps, levator scap and rhomboids                           PT Education - 08/17/16 1041    Education provided Yes   Education Details sitting postural correction;  frequent change of position;  dry needling info   Person(s) Educated Patient   Methods Explanation;Demonstration;Handout   Comprehension Verbalized understanding;Returned demonstration          PT Short Term Goals - 08/17/16 1200      PT SHORT TERM GOAL #1   Title The patient will report understanding of basic self care strategies including change of position, postural correction 09/14/16   Time 4   Period Weeks   Status New     PT SHORT TERM GOAL #2   Title Patient will report a 30% reduction in mid/upper back with home/work ADLs   Time 4   Period Weeks   Status New     PT SHORT TERM GOAL #3   Title Patient will have improved cervical sidebending to 48 degrees bilaterally   Time 4   Period Weeks   Status New           PT Long Term Goals - 08/17/16 1206      PT LONG TERM GOAL #1   Title The patient will be independent in a HEP for further improvements in strength and ROM   10/12/16   Time 8   Period Weeks   Status New     PT LONG TERM GOAL #2   Title The patient will be able to do yoga (modified if needed) with minimal complaint of pain 3/10 or less   Time 8   Period Weeks   Status New     PT LONG TERM GOAL #3   Title The patient will have periscapular strength to grossly 4+/5  needed for scapular stability during UE weightbearing in yoga class   Time 8   Period Weeks   Status New     PT LONG TERM GOAL #4   Title The patient will report pain improved at least 60% with sitting at work and light to moderate lifting   Time 8   Period Weeks   Status New     PT LONG TERM GOAL #  5   Title FOTO functional outcome score improved from 32% limitation to 24% limitation indicating improved function with less pain   Time  8   Period Weeks   Status New               Plan - 08/17/16 1043    Clinical Impression Statement History of upper back pain 10 years ago.  Got help for it 5 years ago.  Periods where it will flare up and then go away.   This episode has been present 3 months.  Pain is primarily left > right medial border of her scapula.  Wonders if her particular style of yoga which involves jumping, head and hand stands, side planks.  Has felt better recently since she has backed off yoga.  Pain is also worsened with stress/anxiety.  Forward head, rounded shoulders and increased thoracic kyphosis.  Decreased thoracic extension and cervical sidebending.  Periscapular muscle weakness on left particularly middle and lower traps.  Left scapula elevated and protracted.  Tender points left upper traps, rhomboids, levator scap muscles.  The patient is of low complexity evaluation secondar to stable clinical presentation, minimal co-morbidities.     Rehab Potential Good   Clinical Impairments Affecting Rehab Potential family history of osteoporosis   PT Frequency 2x / week   PT Duration 8 weeks   PT Treatment/Interventions ADLs/Self Care Home Management;Electrical Stimulation;Cryotherapy;Ultrasound;Moist Heat;Therapeutic activities;Therapeutic exercise;Neuromuscular re-education;Patient/family education;Manual techniques;Dry needling;Taping   PT Next Visit Plan dry needling left rhomboids, levators, upper trap;  postural strengthening; foam roll; thoracic extension;   manual therapy;  modalities;  taping      Patient will benefit from skilled therapeutic intervention in order to improve the following deficits and impairments:  Pain, Decreased strength, Decreased range of motion, Increased muscle spasms, Postural dysfunction  Visit Diagnosis: Pain in thoracic spine - Plan: PT plan of care cert/re-cert  Muscle weakness (generalized) - Plan: PT plan of care cert/re-cert  Abnormal posture - Plan: PT plan of care  cert/re-cert     Problem List Patient Active Problem List   Diagnosis Date Noted  . Upper back pain, chronic 08/03/2016  . Lipoma 04/24/2013  . Generalized anxiety disorder 04/24/2013  . BACK PAIN, THORACIC REGION 06/13/2010  . PALPITATIONS 05/10/2010  . ABNORMAL ELECTROCARDIOGRAM 05/10/2010  . MIGRAINE HEADACHE 05/08/2010  . CARDIAC ARRHYTHMIA 05/08/2010  . Hx gestational diabetes 05/08/2010   Ruben Im, PT 08/17/16 12:25 PM Phone: [phone] Fax: [phone]  Alvera Singh 08/17/2016, 12:23 PM  [facility] [address] [place], [place], [address] Phone: [phone]   Fax:  [phone]  Name: Mary Berg MRN: [id number] Date of Birth: [birth year]

## 2016-08-23 ENCOUNTER — Ambulatory Visit: Payer: PRIVATE HEALTH INSURANCE | Admitting: Physical Therapy

## 2016-08-23 ENCOUNTER — Encounter: Payer: Self-pay | Admitting: Physical Therapy

## 2016-08-23 DIAGNOSIS — M546 Pain in thoracic spine: Secondary | ICD-10-CM | POA: Diagnosis not present

## 2016-08-23 DIAGNOSIS — M6281 Muscle weakness (generalized): Secondary | ICD-10-CM

## 2016-08-23 DIAGNOSIS — R293 Abnormal posture: Secondary | ICD-10-CM

## 2016-08-23 NOTE — Therapy (Signed)
[facility] [address] [place], [place], [address] Phone: [phone]   Fax:  [phone]  Physical Therapy Treatment  Patient Details  Name: Mary Berg MRN: [id number] Date of Birth: 1976-01-04 Referring Provider: Dr. Martinique  Encounter Date: 08/23/2016      PT End of Session - 08/23/16 0849    Visit Number 2   Date for PT Re-Evaluation 10/12/16   PT Start Time 0845   PT Stop Time 0928   PT Time Calculation (min) 43 min   Activity Tolerance Patient tolerated treatment well   Behavior During Therapy [facility] for tasks assessed/performed      Past Medical History:  Diagnosis Date  . Anemia   . Anxiety   . DM (diabetes mellitus), gestational   . Migraine headache     Past Surgical History:  Procedure Laterality Date  . CESAREAN SECTION     primary uncomplicated in 9417  . TAB     history of one uncomplicated TAB in 4081  . TONSILLECTOMY      There were no vitals filed for this visit.      Subjective Assessment - 08/23/16 0847    Subjective Pt reports dull ach in shoulder today.    Pertinent History right hand dom   Limitations House hold activities;Lifting   How long can you sit comfortably? as long as I like but maybe after long day on computer    How long can you walk comfortably? no problem    Diagnostic tests none yet   Patient Stated Goals get back to yoga;  have some ex's to relieve it when it flares up   Currently in Pain? Yes   Pain Score 2    Pain Location Scapula   Pain Orientation Left   Pain Type Chronic pain   Pain Onset More than a month ago   Pain Frequency Intermittent   Aggravating Factors  yoga, lifting, stress/ anxiety   Pain Relieving Factors heat, some stretches, massage, tennis ball                         [facility] Adult PT Treatment/Exercise - 08/23/16 0001      Neuro Re-ed    Neuro Re-ed Details  Serratus and sub scap activation      Exercises   Exercises Shoulder     Shoulder Exercises: Supine   Horizontal ABduction Strengthening;Both;20 reps;Theraband   Theraband Level (Shoulder Horizontal ABduction) Level 2 (Red)   Other Supine Exercises D2 flexion red band  2x10   Other Supine Exercises --     Shoulder Exercises: Standing   External Rotation Strengthening;Both;20 reps;Theraband   Theraband Level (Shoulder External Rotation) Level 1 (Yellow)   Extension Strengthening;Both;20 reps;Theraband   Theraband Level (Shoulder Extension) Level 2 (Red)   Row Strengthening;Both;20 reps;Theraband   Theraband Level (Shoulder Row) Level 2 (Red)   Retraction Strengthening;Both;Theraband   Theraband Level (Shoulder Retraction) Level 1 (Yellow)   Other Standing Exercises Serratus wall slide   Other Standing Exercises Incline push ups 2x10  Verbal cues for posture     Shoulder Exercises: Stretch   Corner Stretch 2 reps;10 seconds                  PT Short Term Goals - 08/17/16 1200      PT SHORT TERM GOAL #1   Title The patient will report understanding of basic self care strategies including change of position, postural correction  09/14/16   Time 4   Period Weeks   Status New     PT SHORT TERM GOAL #2   Title Patient will report a 30% reduction in mid/upper back with home/work ADLs   Time 4   Period Weeks   Status New     PT SHORT TERM GOAL #3   Title Patient will have improved cervical sidebending to 48 degrees bilaterally   Time 4   Period Weeks   Status New           PT Long Term Goals - 08/17/16 1206      PT LONG TERM GOAL #1   Title The patient will be independent in a HEP for further improvements in strength and ROM   10/12/16   Time 8   Period Weeks   Status New     PT LONG TERM GOAL #2   Title The patient will be able to do yoga (modified if needed) with minimal complaint of pain 3/10 or less   Time 8   Period Weeks   Status New     PT LONG TERM GOAL #3   Title The patient will have  periscapular strength to grossly 4+/5  needed for scapular stability during UE weightbearing in yoga class   Time 8   Period Weeks   Status New     PT LONG TERM GOAL #4   Title The patient will report pain improved at least 60% with sitting at work and light to moderate lifting   Time 8   Period Weeks   Status New     PT LONG TERM GOAL #5   Title FOTO functional outcome score improved from 32% limitation to 24% limitation indicating improved function with less pain   Time 8   Period Weeks   Status New               Plan - 08/23/16 3151    Clinical Impression Statement Pt presents with wounded shoulders, increased scapular winging on Rt side. Needing verbal cues for straight posture and keeping head back. Able to tolerate all exercies well and able to feel proper mucsle activation. Pt will continue to benefit frm skilled therapy for upper back stabilization to return to daily activities safely and without pain.    Rehab Potential Good   Clinical Impairments Affecting Rehab Potential family history of osteoporosis   PT Frequency 2x / week   PT Duration 8 weeks   PT Treatment/Interventions ADLs/Self Care Home Management;Electrical Stimulation;Cryotherapy;Ultrasound;Moist Heat;Therapeutic activities;Therapeutic exercise;Neuromuscular re-education;Patient/family education;Manual techniques;Dry needling;Taping   PT Next Visit Plan Scapular stability, gentle shoudler weight bearing, tape      Patient will benefit from skilled therapeutic intervention in order to improve the following deficits and impairments:  Pain, Decreased strength, Decreased range of motion, Increased muscle spasms, Postural dysfunction  Visit Diagnosis: Pain in thoracic spine  Muscle weakness (generalized)  Abnormal posture     Problem List Patient Active Problem List   Diagnosis Date Noted  . Upper back pain, chronic 08/03/2016  . Lipoma 04/24/2013  . Generalized anxiety disorder 04/24/2013  .  BACK PAIN, THORACIC REGION 06/13/2010  . PALPITATIONS 05/10/2010  . ABNORMAL ELECTROCARDIOGRAM 05/10/2010  . MIGRAINE HEADACHE 05/08/2010  . CARDIAC ARRHYTHMIA 05/08/2010  . Hx gestational diabetes 05/08/2010    Mikle Bosworth PTA 08/23/2016, 9:55 AM  [facility] [address] [place], [place], [address] Phone: [phone]   Fax:  [phone]  Name: Mary  Berg MRN: [id number] Date of Birth: [birth year]

## 2016-08-24 ENCOUNTER — Encounter: Payer: Self-pay | Admitting: Physical Therapy

## 2016-08-27 ENCOUNTER — Ambulatory Visit: Payer: PRIVATE HEALTH INSURANCE | Admitting: Physical Therapy

## 2016-08-27 ENCOUNTER — Encounter: Payer: Self-pay | Admitting: Physical Therapy

## 2016-08-27 DIAGNOSIS — M546 Pain in thoracic spine: Secondary | ICD-10-CM | POA: Diagnosis not present

## 2016-08-27 DIAGNOSIS — R293 Abnormal posture: Secondary | ICD-10-CM

## 2016-08-27 DIAGNOSIS — M6281 Muscle weakness (generalized): Secondary | ICD-10-CM

## 2016-08-27 NOTE — Therapy (Signed)
[facility] [address], [place], [place], [address] Phone: [phone]   Fax:  [phone]  Physical Therapy Treatment  Patient Details  Name: Mary Berg MRN: [id number] Date of Birth: 1976-05-31 Referring Provider: Dr. Martinique  Encounter Date: 08/27/2016      PT End of Session - 08/27/16 0929    Visit Number 3   Date for PT Re-Evaluation 10/12/16   PT Start Time 0927   PT Stop Time 1007   PT Time Calculation (min) 40 min   Activity Tolerance Patient tolerated treatment well   Behavior During Therapy [facility] for tasks assessed/performed      Past Medical History:  Diagnosis Date  . Anemia   . Anxiety   . DM (diabetes mellitus), gestational   . Migraine headache     Past Surgical History:  Procedure Laterality Date  . CESAREAN SECTION     primary uncomplicated in 9163  . TAB     history of one uncomplicated TAB in 8466  . TONSILLECTOMY      There were no vitals filed for this visit.      Subjective Assessment - 08/27/16 0928    Subjective Even more subtle of a pain than at last session   Pertinent History right hand dom   Limitations House hold activities;Lifting   How long can you sit comfortably? as long as I like but maybe after long day on computer    How long can you walk comfortably? no problem    Diagnostic tests none yet   Patient Stated Goals get back to yoga;  have some ex's to relieve it when it flares up   Currently in Pain? Yes   Pain Score 1    Pain Location Scapula   Pain Orientation Left   Pain Descriptors / Indicators Dull   Pain Type Chronic pain   Pain Onset More than a month ago   Pain Frequency Intermittent                         [facility] Adult PT Treatment/Exercise - 08/27/16 0001      Shoulder Exercises: Supine   Other Supine Exercises D2 extension red band  2x10     Shoulder Exercises: Prone   Flexion Strengthening;10 reps;Both   Extension  Strengthening;10 reps;Both   Horizontal ABduction 1 Strengthening;Both;10 reps   Other Prone Exercises Plank position  VC to relax chest and stablize scapula     Shoulder Exercises: Standing   External Rotation Strengthening;Both;20 reps;Theraband   Theraband Level (Shoulder External Rotation) Level 1 (Yellow)   Extension Strengthening;Both;20 reps  #15 power tower   Row Strengthening;Both;20 reps;Theraband  #15 power tower   Other Standing Exercises Serratus wall slide   Other Standing Exercises Incline push ups 2x10  On ball     Shoulder Exercises: ROM/Strengthening   UBE (Upper Arm Bike) L1 3x3  Therapist present to discuss treatment     Shoulder Exercises: Stretch   Corner Stretch 2 reps;10 seconds                  PT Short Term Goals - 08/27/16 0929      PT SHORT TERM GOAL #1   Title The patient will report understanding of basic self care strategies including change of position, postural correction 09/14/16   Time 4   Period Weeks   Status On-going     PT SHORT TERM GOAL #2   Title  Patient will report a 30% reduction in mid/upper back with home/work ADLs   Time 4   Period Weeks   Status On-going     PT SHORT TERM GOAL #3   Title Patient will have improved cervical sidebending to 48 degrees bilaterally   Time 4   Period Weeks   Status On-going           PT Long Term Goals - 08/27/16 9528      PT LONG TERM GOAL #1   Title The patient will be independent in a HEP for further improvements in strength and ROM   10/12/16   Time 8   Period Weeks   Status On-going     PT LONG TERM GOAL #2   Title The patient will be able to do yoga (modified if needed) with minimal complaint of pain 3/10 or less   Time 8   Period Weeks   Status On-going     PT LONG TERM GOAL #3   Title The patient will have periscapular strength to grossly 4+/5  needed for scapular stability during UE weightbearing in yoga class   Time 8   Period Weeks   Status On-going     PT  LONG TERM GOAL #4   Title The patient will report pain improved at least 60% with sitting at work and light to moderate lifting   Time 8   Period Weeks   Status On-going     PT LONG TERM GOAL #5   Title FOTO functional outcome score improved from 32% limitation to 24% limitation indicating improved function with less pain   Time 8   Period Weeks   Status On-going               Plan - 08/27/16 1004    Clinical Impression Statement Pt presents with less scapular winging today and slightly better posture. Pt able to tolerate all exercises well with some muscle fatigue but able to maintain good form. Pt practiced plank position for yoga and patient had increase thoracic kyphsis and scapular winging with this position. Pt needing verbal instructions to relax chest muscles and allow back muscles to stabilize scapula.  Pt encouraged to practice this position in mirror at home and make adjustments as needed.    Rehab Potential Good   Clinical Impairments Affecting Rehab Potential family history of osteoporosis   PT Frequency 2x / week   PT Duration 8 weeks   PT Treatment/Interventions ADLs/Self Care Home Management;Electrical Stimulation;Cryotherapy;Ultrasound;Moist Heat;Therapeutic activities;Therapeutic exercise;Neuromuscular re-education;Patient/family education;Manual techniques;Dry needling;Taping   PT Next Visit Plan Scapular stability, gentle shoudler weight bearing      Patient will benefit from skilled therapeutic intervention in order to improve the following deficits and impairments:  Pain, Decreased strength, Decreased range of motion, Increased muscle spasms, Postural dysfunction  Visit Diagnosis: Pain in thoracic spine  Muscle weakness (generalized)  Abnormal posture     Problem List Patient Active Problem List   Diagnosis Date Noted  . Upper back pain, chronic 08/03/2016  . Lipoma 04/24/2013  . Generalized anxiety disorder 04/24/2013  . BACK PAIN, THORACIC  REGION 06/13/2010  . PALPITATIONS 05/10/2010  . ABNORMAL ELECTROCARDIOGRAM 05/10/2010  . MIGRAINE HEADACHE 05/08/2010  . CARDIAC ARRHYTHMIA 05/08/2010  . Hx gestational diabetes 05/08/2010    Mikle Bosworth PTA 08/27/2016, 10:08 AM  [facility] [address], [place], [place], [address] Phone: [phone]   Fax:  [phone]  Name: Mary Berg MRN: [id number] Date  of Birth: [birth year]

## 2016-08-28 ENCOUNTER — Encounter: Payer: Self-pay | Admitting: Physical Therapy

## 2016-08-31 ENCOUNTER — Ambulatory Visit: Payer: PRIVATE HEALTH INSURANCE | Admitting: Physical Therapy

## 2016-08-31 DIAGNOSIS — R293 Abnormal posture: Secondary | ICD-10-CM

## 2016-08-31 DIAGNOSIS — M6281 Muscle weakness (generalized): Secondary | ICD-10-CM

## 2016-08-31 DIAGNOSIS — M546 Pain in thoracic spine: Secondary | ICD-10-CM

## 2016-08-31 NOTE — Patient Instructions (Signed)
     Trigger Point Dry Needling  . What is Trigger Point Dry Needling (DN)? o DN is a physical therapy technique used to treat muscle pain and dysfunction. Specifically, DN helps deactivate muscle trigger points (muscle knots).  o A thin filiform needle is used to penetrate the skin and stimulate the underlying trigger point. The goal is for a local twitch response (LTR) to occur and for the trigger point to relax. No medication of any kind is injected during the procedure.   . What Does Trigger Point Dry Needling Feel Like?  o The procedure feels different for each individual patient. Some patients report that they do not actually feel the needle enter the skin and overall the process is not painful. Very mild bleeding may occur. However, many patients feel a deep cramping in the muscle in which the needle was inserted. This is the local twitch response.   . How Will I feel after the treatment? o Soreness is normal, and the onset of soreness may not occur for a few hours. Typically this soreness does not last longer than two days.  o Bruising is uncommon, however; ice can be used to decrease any possible bruising.  o In rare cases feeling tired or nauseous after the treatment is normal. In addition, your symptoms may get worse before they get better, this period will typically not last longer than 24 hours.   . What Can I do After My Treatment? o Increase your hydration by drinking more water for the next 24 hours. o You may place ice or heat on the areas treated that have become sore, however, do not use heat on inflamed or bruised areas. Heat often brings more relief post needling. o You can continue your regular activities, but vigorous activity is not recommended initially after the treatment for 24 hours. o DN is best combined with other physical therapy such as strengthening, stretching, and other therapies.    Stacy Simpson PT [facility] [address], [address] [place], [place] [address] Phone # [phone] Fax [phone] 

## 2016-08-31 NOTE — Therapy (Signed)
[facility] [address], [place], [place], [address] Phone: [phone]   Fax:  [phone]  Physical Therapy Treatment  Patient Details  Name: Mary Berg MRN: [id number] Date of Birth: Mar 17, 1976 Referring Provider: Dr. Martinique  Encounter Date: 08/31/2016      PT End of Session - 08/31/16 0920    Visit Number 4   Date for PT Re-Evaluation 10/12/16   PT Start Time 0845   PT Stop Time 0935   PT Time Calculation (min) 50 min   Activity Tolerance Patient tolerated treatment well      Past Medical History:  Diagnosis Date  . Anemia   . Anxiety   . DM (diabetes mellitus), gestational   . Migraine headache     Past Surgical History:  Procedure Laterality Date  . CESAREAN SECTION     primary uncomplicated in 7017  . TAB     history of one uncomplicated TAB in 7939  . TONSILLECTOMY      There were no vitals filed for this visit.      Subjective Assessment - 08/31/16 0843    Subjective My shoulder feels really good.  It's really background.  I have not been doing any yoga.  Levator scap region on left   Currently in Pain? Yes   Pain Score 1    Pain Location Shoulder   Pain Type Chronic pain                         [facility] Adult PT Treatment/Exercise - 08/31/16 0001      Moist Heat Therapy   Number Minutes Moist Heat 15 Minutes   Moist Heat Location Shoulder     Electrical Stimulation   Electrical Stimulation Location left scapula   Electrical Stimulation Action IFC   Electrical Stimulation Parameters 8 ma 15 min   Electrical Stimulation Goals Pain     Manual Therapy   Manual Therapy Soft tissue mobilization   Soft tissue mobilization rhomboids, subscap, levator, upper trap, infraspinatus, supraspinatus          Trigger Point Dry Needling - 08/31/16 0850    Consent Given? Yes   Education Handout Provided Yes   Muscles Treated Upper Body Upper trapezius;Levator  scapulae;Rhomboids   Upper Trapezius Response Twitch reponse elicited;Palpable increased muscle length   Levator Scapulae Response Palpable increased muscle length   Rhomboids Response Twitch response elicited;Palpable increased muscle length   Supraspinatus Response Palpable increased muscle length   Infraspinatus Response Palpable increased muscle length   Subscapularis Response Twitch response elicited;Palpable increased muscle length      Left only        PT Education - 08/31/16 0920    Education provided Yes   Education Details dry needling after care   Person(s) Educated Patient   Methods Explanation;Handout   Comprehension Verbalized understanding          PT Short Term Goals - 08/31/16 1205      PT SHORT TERM GOAL #1   Title The patient will report understanding of basic self care strategies including change of position, postural correction 09/14/16   Time 4   Period Weeks   Status On-going     PT SHORT TERM GOAL #2   Title Patient will report a 30% reduction in mid/upper back with home/work ADLs   Time 4   Period Weeks   Status On-going     PT SHORT TERM GOAL #3  Title Patient will have improved cervical sidebending to 48 degrees bilaterally   Time 4   Period Weeks   Status On-going           PT Long Term Goals - 08/31/16 1205      PT LONG TERM GOAL #1   Title The patient will be independent in a HEP for further improvements in strength and ROM   10/12/16   Time 8   Period Weeks   Status On-going     PT LONG TERM GOAL #2   Title The patient will be able to do yoga (modified if needed) with minimal complaint of pain 3/10 or less   Time 8   Period Weeks   Status On-going     PT LONG TERM GOAL #3   Title The patient will have periscapular strength to grossly 4+/5  needed for scapular stability during UE weightbearing in yoga class   Time 8   Period Weeks   Status On-going     PT LONG TERM GOAL #4   Title The patient will report pain improved  at least 60% with sitting at work and light to moderate lifting   Time 8   Period Weeks   Status On-going     PT LONG TERM GOAL #5   Title FOTO functional outcome score improved from 32% limitation to 24% limitation indicating improved function with less pain   Time 8   Period Weeks   Status On-going               Plan - 08/31/16 1201    Clinical Impression Statement The patient has improving shoulder and scapular pain.  She does have numerous tender points in rhomboids, subscapularis, infraspinatus, supraspinatus and upper traps.  Improved muscle lengths following dry needling and manual therapy.  Good relief from pain/discomfort with e-stim/heat.  Patient reports good compliance with HEP.     PT Next Visit Plan assess response to DN #1;  Scapular stability, gentle shoulder weight bearing      Patient will benefit from skilled therapeutic intervention in order to improve the following deficits and impairments:     Visit Diagnosis: Pain in thoracic spine  Muscle weakness (generalized)  Abnormal posture     Problem List Patient Active Problem List   Diagnosis Date Noted  . Upper back pain, chronic 08/03/2016  . Lipoma 04/24/2013  . Generalized anxiety disorder 04/24/2013  . BACK PAIN, THORACIC REGION 06/13/2010  . PALPITATIONS 05/10/2010  . ABNORMAL ELECTROCARDIOGRAM 05/10/2010  . MIGRAINE HEADACHE 05/08/2010  . CARDIAC ARRHYTHMIA 05/08/2010  . Hx gestational diabetes 05/08/2010   Ruben Im, PT 08/31/16 12:07 PM Phone: [phone] Fax: [phone]  Alvera Singh 08/31/2016, 12:06 PM  [facility] [address], [place], [place], [address] Phone: [phone]   Fax:  [phone]  Name: Gerturde Kuba MRN: [id number] Date of Birth: [birth year]

## 2016-09-06 ENCOUNTER — Ambulatory Visit: Payer: PRIVATE HEALTH INSURANCE | Admitting: Physical Therapy

## 2016-09-06 DIAGNOSIS — M546 Pain in thoracic spine: Secondary | ICD-10-CM

## 2016-09-06 DIAGNOSIS — R293 Abnormal posture: Secondary | ICD-10-CM

## 2016-09-06 DIAGNOSIS — M6281 Muscle weakness (generalized): Secondary | ICD-10-CM

## 2016-09-06 NOTE — Therapy (Signed)
[facility] [address], [place], [place], [address] Phone: [phone]   Fax:  [phone]  Physical Therapy Treatment  Patient Details  Name: Mary Berg MRN: [id number] Date of Birth: [birth year] Referring Provider: Dr. Martinique  Encounter Date: 09/06/2016      PT End of Session - 09/06/16 1209    Visit Number 5   Date for PT Re-Evaluation 10/12/16   PT Start Time 1145   PT Stop Time 7048   PT Time Calculation (min) 50 min   Activity Tolerance Patient tolerated treatment well      Past Medical History:  Diagnosis Date  . Anemia   . Anxiety   . DM (diabetes mellitus), gestational   . Migraine headache     Past Surgical History:  Procedure Laterality Date  . CESAREAN SECTION     primary uncomplicated in 8891  . TAB     history of one uncomplicated TAB in 6945  . TONSILLECTOMY      There were no vitals filed for this visit.      Subjective Assessment - 09/06/16 1142    Subjective I'm not sure about the DN.  Some soreness but not too bad.  I have been more sore this week but I'm not sure.  I have been on the computer this week.  I haven't done yoga.    Currently in Pain? Yes   Pain Score 4    Pain Location Shoulder   Pain Orientation Medial;Left   Pain Type Chronic pain   Pain Onset More than a month ago   Pain Frequency Intermittent   Aggravating Factors  sitting at the computer too long   Pain Relieving Factors heat, stretches;                           [facility] Adult PT Treatment/Exercise - 09/06/16 0001      Therapeutic Activites    Therapeutic Activities Other Therapeutic Activities   Other Therapeutic Activities sitting      Neuro Re-ed    Neuro Re-ed Details  middle and lower trap activation      Shoulder Exercises: Supine   Other Supine Exercises red band scapular series 10x each     Shoulder Exercises: Prone   Other Prone Exercises head lift with UE extension,  horizontal abduction, Ws     Shoulder Exercises: ROM/Strengthening   UBE (Upper Arm Bike) L1 3x3  Therapist present to discuss treatment     Moist Heat Therapy   Number Minutes Moist Heat 15 Minutes   Moist Heat Location Shoulder     Electrical Stimulation   Electrical Stimulation Location left scapula   Electrical Stimulation Action IFC   Electrical Stimulation Parameters 8 ma 15 min   Electrical Stimulation Goals Pain                  PT Short Term Goals - 09/06/16 1228      PT SHORT TERM GOAL #1   Title The patient will report understanding of basic self care strategies including change of position, postural correction 09/14/16   Time 4   Period Weeks   Status On-going     PT SHORT TERM GOAL #2   Title Patient will report a 30% reduction in mid/upper back with home/work ADLs   Time 4   Period Weeks   Status On-going     PT SHORT TERM GOAL #3   Title  Patient will have improved cervical sidebending to 48 degrees bilaterally   Time 4   Period Weeks   Status On-going           PT Long Term Goals - 09/06/16 1229      PT LONG TERM GOAL #1   Title The patient will be independent in a HEP for further improvements in strength and ROM   10/12/16   Time 8   Period Weeks   Status On-going     PT LONG TERM GOAL #2   Title The patient will be able to do yoga (modified if needed) with minimal complaint of pain 3/10 or less   Time 8   Period Weeks   Status On-going     PT LONG TERM GOAL #3   Title The patient will have periscapular strength to grossly 4+/5  needed for scapular stability during UE weightbearing in yoga class   Time 8   Period Weeks   Status On-going     PT LONG TERM GOAL #4   Title The patient will report pain improved at least 60% with sitting at work and light to moderate lifting   Time 8   Period Weeks   Status On-going     PT LONG TERM GOAL #5   Title FOTO functional outcome score improved from 32% limitation to 24% limitation  indicating improved function with less pain   Time 8   Period Weeks   Status On-going               Plan - 09/06/16 1224    Clinical Impression Statement The patient has continued left shoulder/scapular region soreness which could be from dry needling last visit.   Today's treatment focus on scapular and postural strengthening.  No change in symptoms with ex.  Assess response/improvement by next visit on helpfulness with dry needling to determine if needed in the future or not.  Some pain and soreness relief with e-stim/heat.     PT Next Visit Plan  Scapular stability, gentle shoulder weight bearing; postural strengthening:  ? try foam roll ex;   manual therapy as needed to left scapular region;  assess progress toward short term goals next week      Patient will benefit from skilled therapeutic intervention in order to improve the following deficits and impairments:     Visit Diagnosis: Pain in thoracic spine  Muscle weakness (generalized)  Abnormal posture     Problem List Patient Active Problem List   Diagnosis Date Noted  . Upper back pain, chronic 08/03/2016  . Lipoma 04/24/2013  . Generalized anxiety disorder 04/24/2013  . BACK PAIN, THORACIC REGION 06/13/2010  . PALPITATIONS 05/10/2010  . ABNORMAL ELECTROCARDIOGRAM 05/10/2010  . MIGRAINE HEADACHE 05/08/2010  . CARDIAC ARRHYTHMIA 05/08/2010  . Hx gestational diabetes 05/08/2010   Ruben Im, PT 09/06/16 4:56 PM Phone: [phone] Fax: [phone]  Alvera Singh 09/06/2016, 4:53 PM  [facility] [address], [place], [place], [address] Phone: [phone]   Fax:  [phone]  Name: Mary Berg MRN: [id number] Date of Birth: 11/04/1975

## 2016-09-10 ENCOUNTER — Ambulatory Visit: Payer: PRIVATE HEALTH INSURANCE | Attending: Family Medicine | Admitting: Physical Therapy

## 2016-09-10 ENCOUNTER — Encounter: Payer: Self-pay | Admitting: Physical Therapy

## 2016-09-10 DIAGNOSIS — M6281 Muscle weakness (generalized): Secondary | ICD-10-CM | POA: Diagnosis present

## 2016-09-10 DIAGNOSIS — R293 Abnormal posture: Secondary | ICD-10-CM | POA: Diagnosis present

## 2016-09-10 DIAGNOSIS — M546 Pain in thoracic spine: Secondary | ICD-10-CM | POA: Diagnosis present

## 2016-09-10 NOTE — Therapy (Signed)
[facility] [address], [place], [place], [address] Phone: [phone]   Fax:  [phone]  Physical Therapy Treatment  Patient Details  Name: Mary Berg MRN: [id number] Date of Birth: May 28, 1976 Referring Provider: Dr. Martinique  Encounter Date: 09/10/2016      PT End of Session - 09/10/16 0847    Visit Number 6   Date for PT Re-Evaluation 10/12/16   PT Start Time 0843   PT Stop Time 0940   PT Time Calculation (min) 57 min   Activity Tolerance Patient tolerated treatment well   Behavior During Therapy [facility]: Lambert Rhonda W for tasks assessed/performed      Past Medical History:  Diagnosis Date  . Anemia   . Anxiety   . DM (diabetes mellitus), gestational   . Migraine headache     Past Surgical History:  Procedure Laterality Date  . CESAREAN SECTION     primary uncomplicated in 0867  . TAB     history of one uncomplicated TAB in 6195  . TONSILLECTOMY      There were no vitals filed for this visit.      Subjective Assessment - 09/10/16 0844    Subjective Back feeling better today. Shoulder really hurting at the end of last week and I don't even know what I did. Couldn't tell if dry needling helped.    Pertinent History right hand dom   Limitations House hold activities;Lifting   How long can you sit comfortably? as long as I like but maybe after long day on computer    How long can you walk comfortably? no problem    Diagnostic tests none yet   Patient Stated Goals get back to yoga;  have some ex's to relieve it when it flares up   Currently in Pain? Yes   Pain Location Shoulder   Pain Orientation Left;Medial   Pain Descriptors / Indicators Dull   Pain Type Chronic pain   Pain Onset More than a month ago   Pain Frequency Intermittent                         [facility] Adult PT Treatment/Exercise - 09/10/16 0001      Therapeutic Activites    Therapeutic Activities Other Therapeutic Activities   Other Therapeutic Activities reaching, pulling     Neuro Re-ed    Neuro Re-ed Details  middle and lower trap activation      Shoulder Exercises: Supine   Other Supine Exercises red band scapular series 20x each     Shoulder Exercises: Prone   Extension Strengthening;10 reps;Both   Horizontal ABduction 1 Strengthening;Both;10 reps   Other Prone Exercises head lift with UE extension, horizontal abduction, Ws     Shoulder Exercises: Standing   Other Standing Exercises Serratus wall slide     Shoulder Exercises: ROM/Strengthening   UBE (Upper Arm Bike) L1 3x3  Therapist present to discuss treatment     Moist Heat Therapy   Number Minutes Moist Heat 15 Minutes   Moist Heat Location Shoulder     Electrical Stimulation   Electrical Stimulation Location Bil upper traps   Electrical Stimulation Action IFC   Electrical Stimulation Parameters 10 ma 15 minutes   Electrical Stimulation Goals Pain                  PT Short Term Goals - 09/10/16 0847      PT SHORT TERM GOAL #1   Title The  patient will report understanding of basic self care strategies including change of position, postural correction 09/14/16   Status Achieved     PT SHORT TERM GOAL #2   Title Patient will report a 30% reduction in mid/upper back with home/work ADLs   Baseline 25%   Status On-going           PT Long Term Goals - 09/10/16 8110      PT LONG TERM GOAL #2   Title The patient will be able to do yoga (modified if needed) with minimal complaint of pain 3/10 or less   Time 8   Period Weeks   Status On-going               Plan - 09/10/16 3159    Clinical Impression Statement Pt with continued left shoulder aching but reports feeling better today than at end of last week. Pt able to activate middle and low traps and serratus anterior well with all exercises needing some verbal cues for proper form. Pt continue to have some limitations with scapular stability. Will continue to benefit  from skilled therapy for shoulder strength to return to normal ADLs with no pain.    Rehab Potential Good   Clinical Impairments Affecting Rehab Potential family history of osteoporosis   PT Frequency 2x / week   PT Duration 8 weeks   PT Treatment/Interventions ADLs/Self Care Home Management;Electrical Stimulation;Cryotherapy;Ultrasound;Moist Heat;Therapeutic activities;Therapeutic exercise;Neuromuscular re-education;Patient/family education;Manual techniques;Dry needling;Taping   PT Next Visit Plan  Scapular stability, gentle shoulder weight bearing; postural strengthening:       Patient will benefit from skilled therapeutic intervention in order to improve the following deficits and impairments:  Pain, Decreased strength, Decreased range of motion, Increased muscle spasms, Postural dysfunction  Visit Diagnosis: Pain in thoracic spine  Muscle weakness (generalized)  Abnormal posture     Problem List Patient Active Problem List   Diagnosis Date Noted  . Upper back pain, chronic 08/03/2016  . Lipoma 04/24/2013  . Generalized anxiety disorder 04/24/2013  . BACK PAIN, THORACIC REGION 06/13/2010  . PALPITATIONS 05/10/2010  . ABNORMAL ELECTROCARDIOGRAM 05/10/2010  . MIGRAINE HEADACHE 05/08/2010  . CARDIAC ARRHYTHMIA 05/08/2010  . Hx gestational diabetes 05/08/2010    Mikle Bosworth PTA 09/10/2016, 9:49 AM  [facility] [address], [place], [place], [address] Phone: [phone]   Fax:  [phone]  Name: Mary Berg MRN: [id number] Date of Birth: 21-Apr-1976

## 2016-09-14 ENCOUNTER — Ambulatory Visit: Payer: PRIVATE HEALTH INSURANCE | Admitting: Physical Therapy

## 2016-09-14 DIAGNOSIS — M546 Pain in thoracic spine: Secondary | ICD-10-CM

## 2016-09-14 DIAGNOSIS — M6281 Muscle weakness (generalized): Secondary | ICD-10-CM

## 2016-09-14 DIAGNOSIS — R293 Abnormal posture: Secondary | ICD-10-CM

## 2016-09-14 NOTE — Therapy (Signed)
[facility] [address], [place], [place], [address] Phone: [phone]   Fax:  [phone]  Physical Therapy Treatment  Patient Details  Name: Mary Berg MRN: [id number] Date of Birth: 1976/01/08 Referring Provider: Dr. Martinique  Encounter Date: 09/14/2016      PT End of Session - 09/14/16 0921    Visit Number 7   Date for PT Re-Evaluation 10/12/16   PT Start Time 0845   PT Stop Time 0935   PT Time Calculation (min) 50 min   Activity Tolerance Patient tolerated treatment well      Past Medical History:  Diagnosis Date  . Anemia   . Anxiety   . DM (diabetes mellitus), gestational   . Migraine headache     Past Surgical History:  Procedure Laterality Date  . CESAREAN SECTION     primary uncomplicated in 6387  . TAB     history of one uncomplicated TAB in 5643  . TONSILLECTOMY      There were no vitals filed for this visit.      Subjective Assessment - 09/14/16 0848    Subjective Reports she has recovered from the stomach bug.  My shoulder is a little tender and I can feel a knot but better than it did last week.  Haven't done any yoga this week.     Currently in Pain? Yes   Pain Score 2    Pain Onset More than a month ago   Pain Frequency Intermittent   Aggravating Factors  yoga, sometimes computer work                         [employer] Adult PT Treatment/Exercise - 09/14/16 0001      Neuro Re-ed    Neuro Re-ed Details  middle and lower trap activation      Lumbar Exercises: Quadruped   Single Arm Raise 5 reps   Straight Leg Raise 5 reps   Opposite Arm/Leg Raise Right arm/Left leg;Left arm/Right leg;5 reps   Plank knees/elbows 10 sec hold 5x     Shoulder Exercises: Prone   Other Prone Exercises head lift with UE extension, horizontal abduction, flexion 8x each     Moist Heat Therapy   Number Minutes Moist Heat 15 Minutes   Moist Heat Location Shoulder     Electrical  Stimulation   Electrical Stimulation Location left shoulder   Electrical Stimulation Action pre-mod   Electrical Stimulation Parameters 10 ma 15 min   Electrical Stimulation Goals Pain     Manual Therapy   Soft tissue mobilization left levator, upper trap, rhomboid          Trigger Point Dry Needling - 09/14/16 0919    Consent Given? Yes   Upper Trapezius Response Twitch reponse elicited;Palpable increased muscle length   Levator Scapulae Response Twitch response elicited;Palpable increased muscle length   Rhomboids Response Twitch response elicited;Palpable increased muscle length                PT Short Term Goals - 09/14/16 1237      PT SHORT TERM GOAL #1   Title The patient will report understanding of basic self care strategies including change of position, postural correction 09/14/16   Status Achieved     PT SHORT TERM GOAL #2   Title Patient will report a 30% reduction in mid/upper back with home/work ADLs   Time 4   Period Weeks   Status On-going  PT SHORT TERM GOAL #3   Title Patient will have improved cervical sidebending to 48 degrees bilaterally   Time 4   Period Weeks   Status On-going           PT Long Term Goals - 09/14/16 1237      PT LONG TERM GOAL #1   Title The patient will be independent in a HEP for further improvements in strength and ROM   10/12/16   Time 8   Period Weeks   Status On-going     PT LONG TERM GOAL #2   Title The patient will be able to do yoga (modified if needed) with minimal complaint of pain 3/10 or less   Time 8   Period Weeks   Status On-going     PT LONG TERM GOAL #3   Title The patient will have periscapular strength to grossly 4+/5  needed for scapular stability during UE weightbearing in yoga class   Time 8   Period Weeks   Status On-going     PT LONG TERM GOAL #4   Title The patient will report pain improved at least 60% with sitting at work and light to moderate lifting   Period Weeks   Status  On-going     PT LONG TERM GOAL #5   Title FOTO functional outcome score improved from 32% limitation to 24% limitation indicating improved function with less pain   Time 8   Period Weeks   Status On-going               Plan - 09/14/16 7989    Clinical Impression Statement The patient initially had quite a bit of soreness from initial dry needling but reports that has finally dissipated and it has been a good week for her left shoulder/scapular pain.  No scapular winging and good scapular symmetry in weight bearing positions with exercise today.  Decreased tender point size and number compared to previous sessions and improved soft tissue mobility noted following today's session.  Check progress next visit to determine the need for continued PT for additional DN and ex progression.     PT Next Visit Plan check % improvement and sidebending ROM for STG/LTGs;  discuss the need for 2-3 more sessions  or follow up in 2-3 weeks if doing well      Patient will benefit from skilled therapeutic intervention in order to improve the following deficits and impairments:     Visit Diagnosis: Pain in thoracic spine  Muscle weakness (generalized)  Abnormal posture     Problem List Patient Active Problem List   Diagnosis Date Noted  . Upper back pain, chronic 08/03/2016  . Lipoma 04/24/2013  . Generalized anxiety disorder 04/24/2013  . BACK PAIN, THORACIC REGION 06/13/2010  . PALPITATIONS 05/10/2010  . ABNORMAL ELECTROCARDIOGRAM 05/10/2010  . MIGRAINE HEADACHE 05/08/2010  . CARDIAC ARRHYTHMIA 05/08/2010  . Hx gestational diabetes 05/08/2010    Alvera Singh 09/14/2016, 12:39 PM  [facility] [address], [place], [place], [address] Phone: [phone]   Fax:  [phone]  Name: Mary Berg MRN: [id number] Date of Birth: [birth year]

## 2016-09-14 NOTE — Therapy (Addendum)
[facility] [address], [place], [place], [address] Phone: [phone]   Fax:  [phone]  Physical Therapy Treatment/Discharge Summary  Patient Details  Name: Mary Berg MRN: [id number] Date of Birth: 19-May-1976 Referring Provider: Dr. Martinique  Encounter Date: 09/14/2016      PT End of Session - 09/14/16 0921    Visit Number 7   Date for PT Re-Evaluation 10/12/16   PT Start Time 0845   PT Stop Time 0935   PT Time Calculation (min) 50 min   Activity Tolerance Patient tolerated treatment well      Past Medical History:  Diagnosis Date  . Anemia   . Anxiety   . DM (diabetes mellitus), gestational   . Migraine headache     Past Surgical History:  Procedure Laterality Date  . CESAREAN SECTION     primary uncomplicated in 5027  . TAB     history of one uncomplicated TAB in 7412  . TONSILLECTOMY      There were no vitals filed for this visit.      Subjective Assessment - 09/14/16 0848    Subjective Reports she has recovered from the stomach bug.  My shoulder is a little tender and I can feel a knot but better than it did last week.  Haven't done any yoga this week.     Currently in Pain? Yes   Pain Score 2    Pain Location Shoulder   Pain Orientation Left   Pain Type Chronic pain   Pain Onset More than a month ago   Pain Frequency Intermittent   Aggravating Factors  yoga, sometimes computer work                         [employer] Adult PT Treatment/Exercise - 09/14/16 0001      Neuro Re-ed    Neuro Re-ed Details  middle and lower trap activation      Lumbar Exercises: Quadruped   Single Arm Raise 5 reps   Straight Leg Raise 5 reps   Opposite Arm/Leg Raise Right arm/Left leg;Left arm/Right leg;5 reps   Plank knees/elbows 10 sec hold 5x     Shoulder Exercises: Prone   Other Prone Exercises head lift with UE extension, horizontal abduction, flexion 8x each     Moist Heat Therapy    Number Minutes Moist Heat 15 Minutes   Moist Heat Location Shoulder     Electrical Stimulation   Electrical Stimulation Location left shoulder   Electrical Stimulation Action pre-mod   Electrical Stimulation Parameters 10 ma 15 min   Electrical Stimulation Goals Pain     Manual Therapy   Soft tissue mobilization left levator, upper trap, rhomboid          Trigger Point Dry Needling - 09/14/16 0919    Consent Given? Yes   Upper Trapezius Response Twitch reponse elicited;Palpable increased muscle length   Levator Scapulae Response Twitch response elicited;Palpable increased muscle length   Rhomboids Response Twitch response elicited;Palpable increased muscle length                PT Short Term Goals - 09/14/16 1237      PT SHORT TERM GOAL #1   Title The patient will report understanding of basic self care strategies including change of position, postural correction 09/14/16   Status Achieved     PT SHORT TERM GOAL #2   Title Patient will report a 30% reduction in  mid/upper back with home/work ADLs   Time 4   Period Weeks   Status On-going     PT SHORT TERM GOAL #3   Title Patient will have improved cervical sidebending to 48 degrees bilaterally   Time 4   Period Weeks   Status On-going           PT Long Term Goals - 09/14/16 1237      PT LONG TERM GOAL #1   Title The patient will be independent in a HEP for further improvements in strength and ROM   10/12/16   Time 8   Period Weeks   Status On-going     PT LONG TERM GOAL #2   Title The patient will be able to do yoga (modified if needed) with minimal complaint of pain 3/10 or less   Time 8   Period Weeks   Status On-going     PT LONG TERM GOAL #3   Title The patient will have periscapular strength to grossly 4+/5  needed for scapular stability during UE weightbearing in yoga class   Time 8   Period Weeks   Status On-going     PT LONG TERM GOAL #4   Title The patient will report pain improved at  least 60% with sitting at work and light to moderate lifting   Period Weeks   Status On-going     PT LONG TERM GOAL #5   Title FOTO functional outcome score improved from 32% limitation to 24% limitation indicating improved function with less pain   Time 8   Period Weeks   Status On-going               Plan - 09/14/16 4098    Clinical Impression Statement The patient initially had quite a bit of soreness from initial dry needling but reports that has finally dissipated and it has been a good week for her left shoulder/scapular pain.  No scapular winging and good scapular symmetry in weight bearing positions with exercise today.  Decreased tender point size and number compared to previous sessions and improved soft tissue mobility noted following today's session.  Check progress next visit to determine the need for continued PT for additional DN and ex progression.     PT Next Visit Plan check % improvement and sidebending ROM for STG/LTGs;  discuss the need for 2-3 more sessions  or follow up in 2-3 weeks if doing well      Patient will benefit from skilled therapeutic intervention in order to improve the following deficits and impairments:     Visit Diagnosis: Pain in thoracic spine  Muscle weakness (generalized)  Abnormal posture    PHYSICAL THERAPY DISCHARGE SUMMARY  Visits from Start of Care: 7  Current functional level related to goals / functional outcomes: The patient was progressing well with PT with interventions including dry needling, exercise, manual therapy and modalities.  She cancelled her last scheduled appt secondary to child care issues.  She has not called back to resume PT and her chart has been inactive > 2 months.  Will discharge at this time with partial goals met.     Remaining deficits: As above   Education / Equipment: HEP Plan: Patient agrees to discharge.  Patient goals were partially met. Patient is being discharged due to not returning  since the last visit.  ?????          Problem List Patient Active Problem List   Diagnosis Date Noted  .  Upper back pain, chronic 08/03/2016  . Lipoma 04/24/2013  . Generalized anxiety disorder 04/24/2013  . BACK PAIN, THORACIC REGION 06/13/2010  . PALPITATIONS 05/10/2010  . ABNORMAL ELECTROCARDIOGRAM 05/10/2010  . MIGRAINE HEADACHE 05/08/2010  . CARDIAC ARRHYTHMIA 05/08/2010  . Hx gestational diabetes 05/08/2010   Ruben Im, PT 09/14/16 12:40 PM Phone: [phone] Fax: [phone]  Alvera Singh 09/14/2016, 12:40 PM  [facility] [address], [place], [place], [address] Phone: [phone]   Fax:  [phone]  Name: Mary Berg MRN: [id number] Date of Birth: 11-15-1975

## 2016-09-28 ENCOUNTER — Ambulatory Visit: Payer: PRIVATE HEALTH INSURANCE | Admitting: Family Medicine

## 2017-02-12 ENCOUNTER — Encounter: Payer: Self-pay | Admitting: Internal Medicine

## 2017-04-17 ENCOUNTER — Ambulatory Visit (AMBULATORY_SURGERY_CENTER): Payer: Self-pay | Admitting: *Deleted

## 2017-04-17 ENCOUNTER — Other Ambulatory Visit: Payer: Self-pay

## 2017-04-17 VITALS — Ht 65.0 in | Wt 143.0 lb

## 2017-04-17 DIAGNOSIS — Z86010 Personal history of colonic polyps: Secondary | ICD-10-CM

## 2017-04-17 MED ORDER — NA SULFATE-K SULFATE-MG SULF 17.5-3.13-1.6 GM/177ML PO SOLN: ORAL | 0 refills | Status: DC

## 2017-04-17 NOTE — Progress Notes (Signed)
Patient denies any allergies to eggs or soy. Patient denies any problems with anesthesia/sedation. Patient denies any oxygen use at home. Patient denies taking any diet/weight loss medications or blood thinners. EMMI education assisgned to patient on colonoscopy, this was explained and instructions given to patient. 

## 2017-05-01 ENCOUNTER — Encounter: Payer: Self-pay | Admitting: Internal Medicine

## 2017-05-01 ENCOUNTER — Ambulatory Visit (AMBULATORY_SURGERY_CENTER): Payer: PRIVATE HEALTH INSURANCE | Admitting: Internal Medicine

## 2017-05-01 ENCOUNTER — Other Ambulatory Visit: Payer: Self-pay

## 2017-05-01 VITALS — BP 102/67 | HR 54 | Temp 98.4°F | Resp 14 | Ht 65.0 in | Wt 143.0 lb

## 2017-05-01 DIAGNOSIS — K644 Residual hemorrhoidal skin tags: Secondary | ICD-10-CM | POA: Diagnosis not present

## 2017-05-01 DIAGNOSIS — K620 Anal polyp: Secondary | ICD-10-CM

## 2017-05-01 DIAGNOSIS — Z86010 Personal history of colonic polyps: Secondary | ICD-10-CM | POA: Diagnosis not present

## 2017-05-01 MED ORDER — SODIUM CHLORIDE 0.9 % IV SOLN: 500.0000 mL | INTRAVENOUS | Status: AC

## 2017-05-01 NOTE — Progress Notes (Signed)
To recovery, report to RN, VSS. 

## 2017-05-01 NOTE — Progress Notes (Signed)
Pt. Reports no change in her medical or surgical history since her pre-visit 04/17/2017.

## 2017-05-01 NOTE — Patient Instructions (Signed)
YOU HAD AN ENDOSCOPIC PROCEDURE TODAY AT [facility]:   Refer to the procedure report that was given to you for any specific questions about what was found during the examination.  If the procedure report does not answer your questions, please call your gastroenterologist to clarify.  If you requested that your care partner not be given the details of your procedure findings, then the procedure report has been included in a sealed envelope for you to review at your convenience later.  YOU SHOULD EXPECT: Some feelings of bloating in the abdomen. Passage of more gas than usual.  Walking can help get rid of the air that was put into your GI tract during the procedure and reduce the bloating. If you had a lower endoscopy (such as a colonoscopy or flexible sigmoidoscopy) you may notice spotting of blood in your stool or on the toilet paper. If you underwent a bowel prep for your procedure, you may not have a normal bowel movement for a few days.  Please Note:  You might notice some irritation and congestion in your nose or some drainage.  This is from the oxygen used during your procedure.  There is no need for concern and it should clear up in a day or so.  SYMPTOMS TO REPORT IMMEDIATELY:   Following lower endoscopy (colonoscopy or flexible sigmoidoscopy):  Excessive amounts of blood in the stool  Significant tenderness or worsening of abdominal pains  Swelling of the abdomen that is new, acute  Fever of 100F or higher   For urgent or emergent issues, a gastroenterologist can be reached at any hour by calling [phone].   DIET:  We do recommend a small meal at first, but then you may proceed to your regular diet.  Drink plenty of fluids but you should avoid alcoholic beverages for 24 hours.  ACTIVITY:  You should plan to take it easy for the rest of today and you should NOT DRIVE or use heavy machinery until tomorrow (because of the sedation medicines used during the test).     FOLLOW UP: Our staff will call the number listed on your records the next business day following your procedure to check on you and address any questions or concerns that you may have regarding the information given to you following your procedure. If we do not reach you, we will leave a message.  However, if you are feeling well and you are not experiencing any problems, there is no need to return our call.  We will assume that you have returned to your regular daily activities without incident.  If any biopsies were taken you will be contacted by phone or by letter within the next 1-3 weeks.  Please call us at [phone] if you have not heard about the biopsies in 3 weeks.    SIGNATURES/CONFIDENTIALITY: You and/or your care partner have signed paperwork which will be entered into your electronic medical record.  These signatures attest to the fact that that the information above on your After Visit Summary has been reviewed and is understood.  Full responsibility of the confidentiality of this discharge information lies with you and/or your care-partner.  We will see you again in 5 years.  Read all of the handouts given to you by your recovery room nurse.

## 2017-05-01 NOTE — Op Note (Signed)
[facility] Patient Name: Mary Berg Procedure Date: 05/01/2017 9:05 AM MRN: [id number] Endoscopist: Jerene Bears , MD Age: 41 Referring MD:  Date of Birth: 12-May-1976 Gender: Female Account #: [id number] Procedure:                Colonoscopy Indications:              Surveillance: Personal history of adenomatous                            polyps on last colonoscopy 5 years ago Medicines:                Monitored Anesthesia Care Procedure:                Pre-Anesthesia Assessment:                           - Prior to the procedure, a History and Physical                            was performed, and patient medications and                            allergies were reviewed. The patient's tolerance of                            previous anesthesia was also reviewed. The risks                            and benefits of the procedure and the sedation                            options and risks were discussed with the patient.                            All questions were answered, and informed consent                            was obtained. Prior Anticoagulants: The patient has                            taken no previous anticoagulant or antiplatelet                            agents. ASA Grade Assessment: II - A patient with                            mild systemic disease. After reviewing the risks                            and benefits, the patient was deemed in                            satisfactory condition to undergo the procedure.  After obtaining informed consent, the colonoscope                            was passed under direct vision. Throughout the                            procedure, the patient's blood pressure, pulse, and                            oxygen saturations were monitored continuously. The                            Model CF-HQ190L ([id number]) scope was introduced                            through the anus and  advanced to the the cecum,                            identified by appendiceal orifice and ileocecal                            valve. The colonoscopy was performed without                            difficulty. The patient tolerated the procedure                            well. The quality of the bowel preparation was                            good. The ileocecal valve, appendiceal orifice, and                            rectum were photographed. Scope In: 9:15:09 AM Scope Out: 9:31:53 AM Scope Withdrawal Time: 0 hours 10 minutes 6 seconds  Total Procedure Duration: 0 hours 16 minutes 44 seconds  Findings:                 The perianal exam findings include a skin tag.                           The colon (entire examined portion) appeared normal.                           A 5 mm polyp versus skin was found in the anus.                            This was biopsied with a cold forceps for histology                            to exclude AIN. Complications:            No immediate complications. Estimated Blood Loss:     Estimated blood loss: none. Impression:               -  Perianal skin tag found on perianal exam.                           - The entire examined colon is normal.                           - One 5 mm polyp versus skin at the anus. Biopsied. Recommendation:           - Patient has a contact number available for                            emergencies. The signs and symptoms of potential                            delayed complications were discussed with the                            patient. Return to normal activities tomorrow.                            Written discharge instructions were provided to the                            patient.                           - Resume previous diet.                           - Continue present medications.                           - Await pathology results.                           - Repeat colonoscopy in 5 years for adenoma                             surveillance. Jerene Bears, MD 05/01/2017 9:39:37 AM This report has been signed electronically.

## 2017-05-01 NOTE — Progress Notes (Signed)
Called to room to assist during endoscopic procedure.  Patient ID and intended procedure confirmed with present staff. Received instructions for my participation in the procedure from the performing physician.  

## 2017-05-06 ENCOUNTER — Telehealth: Payer: Self-pay | Admitting: *Deleted

## 2017-05-06 ENCOUNTER — Encounter: Payer: Self-pay | Admitting: Internal Medicine

## 2017-05-06 NOTE — Telephone Encounter (Signed)
  Follow up Call-  Call back number 05/01/2017  Post procedure Call Back phone  # [phone]  Permission to leave phone message Yes  Some recent data might be hidden     Patient questions:  Do you have a fever, pain , or abdominal swelling? No. Pain Score  0 *  Have you tolerated food without any problems? Yes.    Have you been able to return to your normal activities? Yes.    Do you have any questions about your discharge instructions: Diet   No. Medications  No. Follow up visit  No.  Do you have questions or concerns about your Care? No.  Actions: * If pain score is 4 or above: No action needed, pain <4.

## 2017-09-08 NOTE — Progress Notes (Signed)
HPI:   [name] is a 42 y.o. female, who is here today for anxiety follow up.   She was last seen in 07/2016, she did not follow as recommended.  Hx of somatization disorder. She took Prozac and Wellbutrin in the past. She was also on Xanax 0.25 mg .   She is reporting worsening of symptoms for the past 4 months. She did not try Prozac again as recommended during her last visit.  She took it in 2016 for about 2 years and as she remember it did help. She has not identified specific trigger factors, she has been under stress, work-related.  She denies having a suicidal plan but she states that sometimes she is "fantasizing about it." "Paranoia about having cancer", even though she is up-to-date on her preventive care and has not had any abnormal weight loss, fever, night sweats, or other suggestive symptoms.  She has had panic attacks for the past few days, she has been taking alprazolam 0.25 mg daily as needed, prescription is expired.     Depression screen PHQ 2/9 09/15/2017  Decreased Interest 1  Down, Depressed, Hopeless 2  PHQ - 2 Score 3  Altered sleeping 1  Tired, decreased energy 0  Change in appetite 1  Feeling bad or failure about yourself  2  Trouble concentrating 1  Moving slowly or fidgety/restless 0  Suicidal thoughts 2  PHQ-9 Score 10    She has a mass in lateral aspect of RUQ, which she has had for years, growing slowly, not causing any symptoms.  She has been diagnosed with lipoma. She would like to have lesion removed.  Review of Systems  Constitutional: Negative for activity change, appetite change, fatigue and unexpected weight change.  Respiratory: Negative for chest tightness, shortness of breath and wheezing.   Cardiovascular: Negative for palpitations and leg swelling.  Gastrointestinal: Negative for abdominal pain, diarrhea, nausea and vomiting.  Musculoskeletal: Negative for gait problem and myalgias.  Neurological: Negative for  syncope, weakness and headaches.  Psychiatric/Behavioral: Positive for sleep disturbance. Negative for confusion and hallucinations. The patient is nervous/anxious.      Current Outpatient Medications on File Prior to Visit  Medication Sig Dispense Refill  . Ibuprofen (MOTRIN IB PO) Take 200 mg by mouth as needed (pain).     . Multiple Vitamin (MULTIVITAMIN) capsule Take 1 capsule daily by mouth.    . diphenhydrAMINE (BENADRYL) 25 MG tablet Take 1 tablet (25 mg total) by mouth every 6 (six) hours as needed for itching or allergies. (Patient not taking: Reported on 04/17/2017) 20 tablet 0   Current Facility-Administered Medications on File Prior to Visit  Medication Dose Route Frequency Provider Last Rate Last Dose  . 0.9 %  sodium chloride infusion  500 mL Intravenous Continuous Pyrtle, Lajuan Lines, MD         Past Medical History:  Diagnosis Date  . Anemia   . Anxiety   . DM (diabetes mellitus), gestational   . Migraine headache    Allergies  Allergen Reactions  . Azithromycin Anaphylaxis and Hives  . Erythromycin     REACTION: sensitive stomach    Social History   Socioeconomic History  . Marital status: Married    Spouse name: Not on file  . Number of children: 3  . Years of education: Not on file  . Highest education level: Not on file  Occupational History  . Occupation: Occupational hygienist  . Financial resource strain: Not on  file  . Food insecurity:    Worry: Not on file    Inability: Not on file  . Transportation needs:    Medical: Not on file    Non-medical: Not on file  Tobacco Use  . Smoking status: Former Smoker    Packs/day: 1.00    Years: 10.00    Pack years: 10.00    Types: Cigarettes  . Smokeless tobacco: Never Used  Substance and Sexual Activity  . Alcohol use: Yes    Comment: 7 drinks per week per pt  . Drug use: No  . Sexual activity: Yes    Birth control/protection: IUD  Lifestyle  . Physical activity:    Days per week: Not on file     Minutes per session: Not on file  . Stress: Not on file  Relationships  . Social connections:    Talks on phone: Not on file    Gets together: Not on file    Attends religious service: Not on file    Active member of club or organization: Not on file    Attends meetings of clubs or organizations: Not on file    Relationship status: Not on file  Other Topics Concern  . 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    Physical Exam  Nursing note and vitals reviewed. Constitutional: She is oriented to person, place, and time. She appears well-developed and well-nourished. No distress.  HENT:  Head: Normocephalic.  Mouth/Throat: Oropharynx is clear and moist and mucous membranes are normal.  Eyes: Pupils are equal, round, and reactive to light. Conjunctivae are normal.  Cardiovascular: Normal rate and regular rhythm.  No murmur heard. Respiratory: Effort normal and breath sounds normal. No respiratory distress.  Musculoskeletal: She exhibits no edema.  Lymphadenopathy:    She has no cervical adenopathy.  Neurological: She is alert and oriented to person, place, and time. She has normal strength. Gait normal.  Skin: Skin is warm. No rash noted. No erythema.  RUQ with mobile mass,no tender,rounded. It is about 8 cm.  Psychiatric: Her speech is normal. Her mood appears anxious. Cognition and memory are normal. She expresses no suicidal ideation. She expresses no suicidal plans.  Well-groomed, good eye contact.       ASSESSMENT AND PLAN:   Ms. Mary Berg was seen today for anxiety follow-up.  Orders Placed This Encounter  Procedures  . Ambulatory referral to General Surgery    1. Generalized anxiety disorder  She would like to try Prozac again, 20 mg daily. She  will continue alprazolam 0.25 mg daily as needed. Some side effects from medications discussed. Psychotherapy also recommended. He suicidal ideation or plan she needs to go to the ER. I will see her back in 3-4 weeks, before if needed.  - FLUoxetine (PROZAC) 20 MG capsule; Take 1 capsule (20 mg total) by mouth daily.  Dispense: 30 capsule; Refill: 2 - ALPRAZolam (XANAX) 0.25 MG tablet; Take 1 tablet (0.25 mg total) by mouth daily as needed for anxiety.  Dispense: 30 tablet; Refill: 0  2. Lipoma, unspecified site  She will follow with general surgeon to discuss surgical options.  - Ambulatory referral to General Surgery    -  Ms. Mary Berg was advised to return sooner than planned today if new concerns arise.       Aissa Lisowski G. Martinique, MD  [facility] [place] office.

## 2017-09-09 ENCOUNTER — Ambulatory Visit: Payer: PRIVATE HEALTH INSURANCE | Admitting: Family Medicine

## 2017-09-09 ENCOUNTER — Encounter: Payer: Self-pay | Admitting: Family Medicine

## 2017-09-09 VITALS — BP 122/70 | HR 75 | Temp 99.0°F | Resp 12 | Ht 65.0 in | Wt 145.1 lb

## 2017-09-09 DIAGNOSIS — F411 Generalized anxiety disorder: Secondary | ICD-10-CM | POA: Diagnosis not present

## 2017-09-09 DIAGNOSIS — D179 Benign lipomatous neoplasm, unspecified: Secondary | ICD-10-CM | POA: Diagnosis not present

## 2017-09-09 MED ORDER — FLUOXETINE HCL 20 MG PO CAPS: 20.0000 mg | ORAL_CAPSULE | Freq: Every day | ORAL | 2 refills | Status: DC

## 2017-09-09 MED ORDER — ALPRAZOLAM 0.25 MG PO TABS: 0.2500 mg | ORAL_TABLET | Freq: Every day | ORAL | 0 refills | Status: DC | PRN

## 2017-09-09 NOTE — Patient Instructions (Addendum)
A few things to remember from today's visit:   Generalized anxiety disorder - Plan: FLUoxetine (PROZAC) 20 MG capsule   Today we started Prozac, this type of medications can increase suicidal risk. This is more prevalent among children,adolecents, and young adults with major depression or other psychiatric disorders. It can also make depression worse. Most common side effects are gastrointestinal, self limited after a few weeks: diarrhea, nausea, constipation  Or diarrhea among some.  In general it is well tolerated. We will follow closely.   Please arrange appointment for psychotherapy/counseling.   Please be sure medication list is accurate. If a new problem present, please set up appointment sooner than planned today.

## 2017-10-06 NOTE — Progress Notes (Signed)
HPI:   Mary Berg is a 42 y.o. female, who is here today to follow on recent OV.   She was seen on 09/09/17, when she was c/o worsening anxiety. She was started on Prozac 20 mg. She is also on Xanax 0.25 mg daily prn.  Depressed mood has improved, she is crying less often. She has had a couple of panic attacks since her last visit, last one was "pretty bad",scare of having health issues.  In general no changes in anxiety.Xanax helps with acute anxiety.  She has tolerated medication well in general except for decreased libido and mild nausea. She denies suicidal thoughts or changes in sleep.  She has not arrange appointment with psychotherapy. No suicidal thoughts.   Cough: She has had mild nonproductive cough, afraid of having lung cancer or any other serious process. Former smoker. Hx of allergies. She has had mild, intermittent wheezing.  No history of asthma. She has no noted abnormal weight loss, night sweats, fever, chills, dyspnea, or chest pain.  Review of Systems  Constitutional: Negative for activity change, appetite change, fatigue, fever and unexpected weight change.  HENT: Negative for congestion, mouth sores, sore throat, trouble swallowing and voice change.   Respiratory: Positive for cough and wheezing. Negative for shortness of breath.   Cardiovascular: Negative for chest pain.  Gastrointestinal: Positive for nausea. Negative for abdominal pain, diarrhea and vomiting.  Musculoskeletal: Negative for back pain and myalgias.  Skin: Negative for rash.  Allergic/Immunologic: Positive for environmental allergies.  Neurological: Negative for weakness and headaches.  Hematological: Negative for adenopathy. Does not bruise/bleed easily.  Psychiatric/Behavioral: Negative for confusion and suicidal ideas. The patient is nervous/anxious.       Current Outpatient Medications on File Prior to Visit  Medication Sig Dispense Refill  . ALPRAZolam (XANAX) 0.25 MG  tablet Take 1 tablet (0.25 mg total) by mouth daily as needed for anxiety. 30 tablet 0  . FLUoxetine (PROZAC) 20 MG capsule Take 1 capsule (20 mg total) by mouth daily. 30 capsule 2  . Ibuprofen (MOTRIN IB PO) Take 200 mg by mouth as needed (pain).     . Multiple Vitamin (MULTIVITAMIN) capsule Take 1 capsule daily by mouth.    . diphenhydrAMINE (BENADRYL) 25 MG tablet Take 1 tablet (25 mg total) by mouth every 6 (six) hours as needed for itching or allergies. (Patient not taking: Reported on 04/17/2017) 20 tablet 0   Current Facility-Administered Medications on File Prior to Visit  Medication Dose Route Frequency Provider Last Rate Last Dose  . 0.9 %  sodium chloride infusion  500 mL Intravenous Continuous Pyrtle, Lajuan Lines, MD         Past Medical History:  Diagnosis Date  . Anemia   . Anxiety   . DM (diabetes mellitus), gestational   . Migraine headache    Allergies  Allergen Reactions  . Azithromycin Anaphylaxis and Hives  . Erythromycin     REACTION: sensitive stomach    Social History   Socioeconomic History  . Marital status: Married    Spouse name: Not on file  . Number of children: 3  . Years of education: Not on file  . Highest education level: Not on file  Occupational History  . Occupation: Occupational hygienist  . Financial resource strain: Not on file  . Food insecurity:    Worry: Not on file    Inability: Not on file  . Transportation needs:    Medical: Not on file  Non-medical: Not on file  Tobacco Use  . Smoking status: Former Smoker    Packs/day: 1.00    Years: 10.00    Pack years: 10.00    Types: Cigarettes  . Smokeless tobacco: Never Used  Substance and Sexual Activity  . Alcohol use: Yes    Comment: 7 drinks per week per pt  . Drug use: No  . Sexual activity: Yes    Birth control/protection: IUD  Lifestyle  . Physical activity:    Days per week: Not on file    Minutes per session: Not on file  . Stress: Not on file  Relationships  .  Social connections:    Talks on phone: Not on file    Gets together: Not on file    Attends religious service: Not on file    Active member of club or organization: Not on file    Attends meetings of clubs or organizations: Not on file    Relationship status: Not on file  Other Topics Concern  . 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   Physical Exam  Nursing note and vitals reviewed. Constitutional: She is oriented to person, place, and time. She appears well-developed and well-nourished. No distress.  HENT:  Head: Normocephalic and atraumatic.  Mouth/Throat: Oropharynx is clear and moist and mucous membranes are normal.  Eyes: Pupils are equal, round, and reactive to light. Conjunctivae are normal.  Cardiovascular: Normal rate and regular rhythm.  No murmur heard. Respiratory: Effort normal and breath sounds normal. No respiratory distress. She has no wheezes. She has no rhonchi. She has no rales.  Prolonged expiration.  Musculoskeletal: She exhibits no edema.  Lymphadenopathy:    She has no cervical adenopathy.  Neurological: She is alert and oriented to person, place, and time. She has normal strength. Gait normal.  Skin: Skin is warm. No erythema.  Psychiatric: Her mood appears anxious. Cognition and memory are normal. She expresses no suicidal ideation. She expresses no suicidal plans.  Well-groomed, good eye contact.    ASSESSMENT AND PLAN:  Mary Berg was seen today for follow-up.  Diagnoses and all orders for this visit:   Cough  Possible etiologies discussed: Residual after URI,allervies,GI among some.  At this time I do not think it is related to a serious condition like cancer,reassured. I do not think imaging is needed at this time, she agrees  with holding on imagine. Instructed to let me know if she is not any better in 3-4 weeks,in which case imaging will be necessaty.  -     albuterol (PROVENTIL HFA;VENTOLIN HFA) 108 (90 Base) MCG/ACT inhaler; Inhale 2 puffs into the lungs every 6 (six) hours as needed for wheezing or shortness of breath.  Reactive airway disease that is not asthma  No wheezing today,lung auscultation otherwise normal. Albuterol inh 2 puff every 6 hours for a week then as needed for wheezing or shortness of breath.  Instructed about warning signs. F/U as needed.  -     albuterol (PROVENTIL HFA;VENTOLIN HFA) 108 (90 Base) MCG/ACT inhaler; Inhale 2 puffs into the lungs every 6 (six) hours as needed for wheezing or shortness of breath.   Generalized anxiety disorder For  now she would like to continue current management. We discussed some side effects of Prozac.If side effects do not improve, we will consider Effexor. She will let me know in about 4 weeks if any change in anxiety or side effects. Strongly recommend arrange an appointment with psychotherapist. Follow-up in 3 to 4 months.      Betty G. Martinique, MD  [facility]. [place] office.

## 2017-10-07 ENCOUNTER — Encounter: Payer: Self-pay | Admitting: Family Medicine

## 2017-10-07 ENCOUNTER — Ambulatory Visit: Payer: PRIVATE HEALTH INSURANCE | Admitting: Family Medicine

## 2017-10-07 VITALS — BP 123/80 | HR 70 | Temp 99.6°F | Resp 12 | Ht 65.0 in | Wt 143.0 lb

## 2017-10-07 DIAGNOSIS — F411 Generalized anxiety disorder: Secondary | ICD-10-CM | POA: Diagnosis not present

## 2017-10-07 DIAGNOSIS — R059 Cough, unspecified: Secondary | ICD-10-CM

## 2017-10-07 DIAGNOSIS — R0989 Other specified symptoms and signs involving the circulatory and respiratory systems: Secondary | ICD-10-CM | POA: Diagnosis not present

## 2017-10-07 DIAGNOSIS — J989 Respiratory disorder, unspecified: Secondary | ICD-10-CM

## 2017-10-07 DIAGNOSIS — R05 Cough: Secondary | ICD-10-CM | POA: Diagnosis not present

## 2017-10-07 MED ORDER — ALBUTEROL SULFATE HFA 108 (90 BASE) MCG/ACT IN AERS: 2.0000 | INHALATION_SPRAY | Freq: Four times a day (QID) | RESPIRATORY_TRACT | 0 refills | Status: DC | PRN

## 2017-10-07 NOTE — Patient Instructions (Addendum)
A few things to remember from today's visit:   Generalized anxiety disorder  Cough - Plan: albuterol (PROVENTIL HFA;VENTOLIN HFA) 108 (90 Base) MCG/ACT inhaler  Reactive airway disease that is not asthma - Plan: albuterol (PROVENTIL HFA;VENTOLIN HFA) 108 (90 Base) MCG/ACT inhaler Please let me know in 4 weeks if anxiety is better controlled. We will consider Effexor if we need to change medication due to side effects. No changes in Xanax. Please schedule psychotherapy.  Albuterol inh 2 puff every 6 hours for a week then as needed for wheezing or shortness of breath.   Let me know if you are still coughing in 3 to 4 weeks, in this case we can arrange chest x-ray.  Please be sure medication list is accurate. If a new problem present, please set up appointment sooner than planned today.

## 2017-10-07 NOTE — Assessment & Plan Note (Signed)
For now she would like to continue current management. We discussed some side effects of Prozac.If side effects do not improve, we will consider Effexor. She will let me know in about 4 weeks if any change in anxiety or side effects. Strongly recommend arrange an appointment with psychotherapist. Follow-up in 3 to 4 months.

## 2017-10-13 ENCOUNTER — Encounter: Payer: Self-pay | Admitting: Family Medicine

## 2017-10-31 ENCOUNTER — Encounter: Payer: Self-pay | Admitting: Family Medicine

## 2017-10-31 ENCOUNTER — Ambulatory Visit: Payer: PRIVATE HEALTH INSURANCE | Admitting: Family Medicine

## 2017-10-31 VITALS — BP 116/74 | HR 73 | Temp 98.8°F | Resp 12 | Ht 65.0 in | Wt 145.0 lb

## 2017-10-31 DIAGNOSIS — I889 Nonspecific lymphadenitis, unspecified: Secondary | ICD-10-CM | POA: Diagnosis not present

## 2017-10-31 DIAGNOSIS — L237 Allergic contact dermatitis due to plants, except food: Secondary | ICD-10-CM | POA: Diagnosis not present

## 2017-10-31 MED ORDER — PREDNISONE 20 MG PO TABS
ORAL_TABLET | ORAL | 0 refills | Status: AC
Start: 2017-10-31 — End: 2017-11-07

## 2017-10-31 MED ORDER — DOXYCYCLINE HYCLATE 100 MG PO TABS: 100.0000 mg | ORAL_TABLET | Freq: Two times a day (BID) | ORAL | 0 refills | Status: AC

## 2017-10-31 NOTE — Progress Notes (Signed)
ACUTE VISIT   HPI:  Chief Complaint  Patient presents with  . Poison Ivy    spreading and may be getting infected    Mary Berg is a 42 y.o. female, who is here today complaining of very pruritic rash noted about 7 days ago. According to patient, her husband was exposed to poison ivy while he was working outdoors and currently he is being treated for contact dermatitis  complicated with bacterial infection. No exacerbating or alleviating factors identified.  She thinks she got in contact with poison ivy when she was cleaning and washing her husband's clothes,2-3 days prior to rash onset. No new detergent, body lotion, soap, or medication.  Initially rash was noted on right thigh, next day she noticed a blister on right lower and upper back.  Problem seems to be getting worse.  Rash on the thigh is mildly tender.   She is concerned about possible cellulitis. A couple days ago she noted a large and tender right inguinal lymph nodes. No pelvic pain, vaginal discharge/lesions. She has not had fever, chills, body aches.  She has not used OTC medication.   Review of Systems  Constitutional: Negative for appetite change, chills, fatigue and fever.  HENT: Negative for congestion, mouth sores, sore throat, trouble swallowing and voice change.   Eyes: Negative for discharge and redness.  Respiratory: Negative for cough, chest tightness, shortness of breath and wheezing.   Gastrointestinal: Negative for abdominal pain, diarrhea, nausea and vomiting.  Genitourinary: Negative for genital sores, pelvic pain and vaginal discharge.  Musculoskeletal: Negative for arthralgias, joint swelling and myalgias.  Skin: Positive for rash. Negative for wound.  Neurological: Negative for weakness and numbness.  Hematological: Positive for adenopathy. Does not bruise/bleed easily.  Psychiatric/Behavioral: Negative for confusion. The patient is nervous/anxious.       Current Outpatient  Medications on File Prior to Visit  Medication Sig Dispense Refill  . albuterol (PROVENTIL HFA;VENTOLIN HFA) 108 (90 Base) MCG/ACT inhaler Inhale 2 puffs into the lungs every 6 (six) hours as needed for wheezing or shortness of breath. 1 Inhaler 0  . ALPRAZolam (XANAX) 0.25 MG tablet Take 1 tablet (0.25 mg total) by mouth daily as needed for anxiety. 30 tablet 0  . diphenhydrAMINE (BENADRYL) 25 MG tablet Take 1 tablet (25 mg total) by mouth every 6 (six) hours as needed for itching or allergies. 20 tablet 0  . FLUoxetine (PROZAC) 20 MG capsule Take 1 capsule (20 mg total) by mouth daily. 30 capsule 2  . Ibuprofen (MOTRIN IB PO) Take 200 mg by mouth as needed (pain).     . Multiple Vitamin (MULTIVITAMIN) capsule Take 1 capsule daily by mouth.     Current Facility-Administered Medications on File Prior to Visit  Medication Dose Route Frequency Provider Last Rate Last Dose  . 0.9 %  sodium chloride infusion  500 mL Intravenous Continuous Pyrtle, Lajuan Lines, MD         Past Medical History:  Diagnosis Date  . Anemia   . Anxiety   . DM (diabetes mellitus), gestational   . Migraine headache    Allergies  Allergen Reactions  . Azithromycin Anaphylaxis and Hives  . Erythromycin     REACTION: sensitive stomach    Social History   Socioeconomic History  . Marital status: Married    Spouse name: Not on file  . Number of children: 3  . Years of education: Not on file  . Highest education level: Not on file  Occupational History  . Occupation: Occupational hygienist  . Financial resource strain: Not on file  . Food insecurity:    Worry: Not on file    Inability: Not on file  . Transportation needs:    Medical: Not on file    Non-medical: Not on file  Tobacco Use  . Smoking status: Former Smoker    Packs/day: 1.00    Years: 10.00    Pack years: 10.00    Types: Cigarettes  . Smokeless tobacco: Never Used  Substance and Sexual Activity  . Alcohol use: Yes    Comment: 7 drinks per  week per pt  . Drug use: No  . Sexual activity: Yes    Birth control/protection: IUD  Lifestyle  . Physical activity:    Days per week: Not on file    Minutes per session: Not on file  . Stress: Not on file  Relationships  . Social connections:    Talks on phone: Not on file    Gets together: Not on file    Attends religious service: Not on file    Active member of club or organization: Not on file    Attends meetings of clubs or organizations: Not on file    Relationship status: Not on file  Other Topics Concern  . 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    Physical Exam  Nursing note and vitals reviewed. Constitutional: She is oriented to person, place, and time. She appears well-developed and well-nourished. No distress.  HENT:  Head: Normocephalic and atraumatic.  Mouth/Throat: Oropharynx is clear and moist and mucous membranes are normal.  Eyes: Conjunctivae are normal.  Respiratory: Effort normal and breath sounds normal. No respiratory distress.  Musculoskeletal: She exhibits no edema.  Lymphadenopathy:    She has no cervical adenopathy.       Right: Inguinal (Mildly tender.) adenopathy present.       Left: No inguinal adenopathy present.  Neurological: She is alert and oriented to person, place, and time. She has normal strength. Gait normal.  Skin: Skin is warm. Rash noted.     Macular erythematous rash and some confluent maculopapular rash on lateral aspect of right thigh, lower and mid back.  Scattered lesions on right forearm. Lesions on thigh are more erythematous but no induration or fluctuant areas. Scratching marks. Mild local heat.  Psychiatric: Her mood appears anxious.  Well groomed, good eye contact.      ASSESSMENT AND  PLAN:   Mary Berg was seen today for poison ivy.  Diagnoses and all orders for this visit:  Contact dermatitis due to poison ivy  Because extension of recent, I recommend oral steroids. We discussed some side effects. She will monitor for warning signs. OTC Pepcid 20 mg twice daily and/or Zyrtec 10 mg twice daily for 2 weeks may help. Follow-up as needed.  -     predniSONE (DELTASONE) 20 MG tablet; 3 tabs for 3 days, 2 tabs for 3 days, 1 tabs for 3 days, and 1/2 tab for 3 days. Take tables together with breakfast.  Inguinal lymphadenitis  ? Reactive lymphadenopathy. Skin lesions without signs of bacterial infection. I did send prescription for doxycycline,  recommend starting if lesions are not improved 48 to 72 hours after she starts oral prednisone. Recommend avoiding scratching lesions. Instructed about warning signs. Some side effects of antibiotics discussed. Follow-up as needed.  -     doxycycline (VIBRA-TABS) 100 MG tablet; Take 1 tablet (100 mg total) by mouth 2 (two) times daily for 7 days.    Return if symptoms worsen or fail to improve.     Alyscia Carmon G. Martinique, MD  [facility]. [place] office.

## 2017-10-31 NOTE — Patient Instructions (Signed)
A few things to remember from today's visit:   Contact dermatitis due to poison ivy - Plan: predniSONE (DELTASONE) 20 MG tablet  Inguinal lymphadenitis - Plan: doxycycline (VIBRA-TABS) 100 MG tablet   Contact Dermatitis Dermatitis is redness, soreness, and swelling (inflammation) of the skin. Contact dermatitis is a reaction to certain substances that touch the skin. You either touched something that irritated your skin, or you have allergies to something you touched. Follow these instructions at home: [place] your skin as needed.  Apply cool compresses to the affected areas.  Try taking a bath with: ? Epsom salts. Follow the instructions on the package. You can get these at a pharmacy or grocery store. ? Baking soda. Pour a small amount into the bath as told by your doctor. ? Colloidal oatmeal. Follow the instructions on the package. You can get this at a pharmacy or grocery store.  Try applying baking soda paste to your skin. Stir water into baking soda until it looks like paste.  Do not scratch your skin.  Bathe less often.  Bathe in lukewarm water. Avoid using hot water. Medicines  Take or apply over-the-counter and prescription medicines only as told by your doctor.  If you were prescribed an antibiotic medicine, take or apply your antibiotic as told by your doctor. Do not stop taking the antibiotic even if your condition starts to get better. General instructions  Keep all follow-up visits as told by your doctor. This is important.  Avoid the substance that caused your reaction. If you do not know what caused it, keep a journal to try to track what caused it. Write down: ? What you eat. ? What cosmetic products you use. ? What you drink. ? What you wear in the affected area. This includes jewelry.  If you were given a bandage (dressing), take care of it as told by your doctor. This includes when to change and remove it. Contact a doctor if:  You do  not get better with treatment.  Your condition gets worse.  You have signs of infection such as: ? Swelling. ? Tenderness. ? Redness. ? Soreness. ? Warmth.  You have a fever.  You have new symptoms. Get help right away if:  You have a very bad headache.  You have neck pain.  Your neck is stiff.  You throw up (vomit).  You feel very sleepy.  You see red streaks coming from the affected area.  Your bone or joint underneath the affected area becomes painful after the skin has healed.  The affected area turns darker.  You have trouble breathing. This information is not intended to replace advice given to you by your health care provider. Make sure you discuss any questions you have with your health care provider. Document Released: 03/25/2009 Document Revised: 11/03/2015 Document Reviewed: 10/13/2014 Elsevier Interactive Patient Education  2018 [employer].  Please be sure medication list is accurate. If a new problem present, please set up appointment sooner than planned today.

## 2017-11-04 ENCOUNTER — Other Ambulatory Visit: Payer: Self-pay | Admitting: Family Medicine

## 2017-11-04 DIAGNOSIS — R0989 Other specified symptoms and signs involving the circulatory and respiratory systems: Secondary | ICD-10-CM

## 2017-11-04 DIAGNOSIS — R05 Cough: Secondary | ICD-10-CM

## 2017-11-04 DIAGNOSIS — J989 Respiratory disorder, unspecified: Secondary | ICD-10-CM

## 2017-11-04 DIAGNOSIS — R059 Cough, unspecified: Secondary | ICD-10-CM

## 2017-11-13 ENCOUNTER — Encounter: Payer: Self-pay | Admitting: Family Medicine

## 2018-02-07 ENCOUNTER — Ambulatory Visit: Payer: Self-pay | Admitting: Family Medicine

## 2018-07-16 ENCOUNTER — Encounter: Payer: Self-pay | Admitting: Family Medicine

## 2018-07-16 ENCOUNTER — Ambulatory Visit: Payer: PRIVATE HEALTH INSURANCE | Admitting: Family Medicine

## 2018-07-16 ENCOUNTER — Ambulatory Visit (INDEPENDENT_AMBULATORY_CARE_PROVIDER_SITE_OTHER): Payer: PRIVATE HEALTH INSURANCE

## 2018-07-16 ENCOUNTER — Encounter: Payer: Self-pay | Admitting: *Deleted

## 2018-07-16 VITALS — BP 120/66 | HR 70 | Temp 98.8°F | Resp 12 | Ht 65.0 in | Wt 157.0 lb

## 2018-07-16 DIAGNOSIS — R05 Cough: Secondary | ICD-10-CM

## 2018-07-16 DIAGNOSIS — R053 Chronic cough: Secondary | ICD-10-CM

## 2018-07-16 DIAGNOSIS — J4520 Mild intermittent asthma, uncomplicated: Secondary | ICD-10-CM | POA: Diagnosis not present

## 2018-07-16 DIAGNOSIS — R0981 Nasal congestion: Secondary | ICD-10-CM | POA: Diagnosis not present

## 2018-07-16 MED ORDER — PREDNISONE 20 MG PO TABS: 40.0000 mg | ORAL_TABLET | Freq: Every day | ORAL | 0 refills | Status: AC

## 2018-07-16 MED ORDER — DOXYCYCLINE HYCLATE 100 MG PO TABS: 100.0000 mg | ORAL_TABLET | Freq: Two times a day (BID) | ORAL | 0 refills | Status: AC

## 2018-07-16 NOTE — Patient Instructions (Signed)
A few things to remember from today's visit:   Cough, persistent - Plan: DG Chest 2 View  Asthma, intermittent, uncomplicated  Nasal sinus congestion  I do not think antibiotic is needed at this time, you can fill prescription for doxycycline if you are not greatly improved in 3 to 4 days. Take prednisone with breakfast. Albuterol inh 2 puff every 6 hours for a week then as needed for wheezing or shortness of breath.    Please be sure medication list is accurate. If a new problem present, please set up appointment sooner than planned today.

## 2018-07-16 NOTE — Progress Notes (Addendum)
ACUTE VISIT  HPI:  Chief Complaint  Patient presents with  . Cough    sx started almost 1 month ago  . Sinus congestion and drainage    Mary Berg is a 43 y.o.female here today complaining of 4 weeks of intermittent respiratory symptoms. Productive cough with clear sputum. Greenish rhinorrhea and facial pressure. Facial pain. Symptoms are worse in the morning when she first gets up, they get better throughout the day. Feels "crappy." Intermittent wheezing and "little" exertional dyspnea. Right ear fullness sensation.  Asthma, last time she used albuterol was 2 days ago.  Cough  The problem has been waxing and waning. The cough is productive of sputum. Associated symptoms include ear pain, postnasal drip, shortness of breath and wheezing. Pertinent negatives include no eye redness, fever, headaches, myalgias, rash or sore throat. The symptoms are aggravated by exercise. She has tried a beta-agonist inhaler for the symptoms. The treatment provided mild relief. Her past medical history is significant for asthma and environmental allergies.   No Hx of recent travel. Husband and son have been sick with similar symptoms.  No known insect bite.  OTC medications for this problem: None   Review of Systems  Constitutional: Positive for fatigue. Negative for activity change, appetite change and fever.  HENT: Positive for congestion, ear pain, postnasal drip and sinus pressure. Negative for facial swelling, mouth sores, sore throat, trouble swallowing and voice change.   Eyes: Negative for discharge, redness and itching.  Respiratory: Positive for cough, shortness of breath and wheezing.   Cardiovascular: Negative.   Gastrointestinal: Negative for abdominal pain, diarrhea, nausea and vomiting.  Musculoskeletal: Negative for myalgias and neck pain.  Skin: Negative for rash.  Allergic/Immunologic: Positive for environmental allergies.  Neurological: Negative for syncope,  weakness and headaches.  Hematological: Negative for adenopathy. Does not bruise/bleed easily.      Current Outpatient Medications on File Prior to Visit  Medication Sig Dispense Refill  . albuterol (PROVENTIL HFA;VENTOLIN HFA) 108 (90 Base) MCG/ACT inhaler INHALE 2 PUFFS INTO THE LUNGS EVERY 6 HOURS AS NEEDED FOR WHEEZING OR SHORTNESS OF BREATH 6.7 g 0  . ALPRAZolam (XANAX) 0.25 MG tablet Take 1 tablet (0.25 mg total) by mouth daily as needed for anxiety. 30 tablet 0  . diphenhydrAMINE (BENADRYL) 25 MG tablet Take 1 tablet (25 mg total) by mouth every 6 (six) hours as needed for itching or allergies. 20 tablet 0  . Ibuprofen (MOTRIN IB PO) Take 200 mg by mouth as needed (pain).     . Multiple Vitamin (MULTIVITAMIN) capsule Take 1 capsule daily by mouth.     Current Facility-Administered Medications on File Prior to Visit  Medication Dose Route Frequency Provider Last Rate Last Dose  . 0.9 %  sodium chloride infusion  500 mL Intravenous Continuous Pyrtle, Lajuan Lines, MD         Past Medical History:  Diagnosis Date  . Anemia   . Anxiety   . DM (diabetes mellitus), gestational   . Migraine headache    Allergies  Allergen Reactions  . Azithromycin Anaphylaxis and Hives  . Erythromycin     REACTION: sensitive stomach    Social History   Socioeconomic History  . Marital status: Married    Spouse name: Not on file  . Number of children: 3  . Years of education: Not on file  . Highest education level: Not on file  Occupational History  . Occupation: Occupational hygienist  . Financial  resource strain: Not on file  . Food insecurity:    Worry: Not on file    Inability: Not on file  . Transportation needs:    Medical: Not on file    Non-medical: Not on file  Tobacco Use  . Smoking status: Former Smoker    Packs/day: 1.00    Years: 10.00    Pack years: 10.00    Types: Cigarettes  . Smokeless tobacco: Never Used  Substance and Sexual Activity  . Alcohol use: Yes     Comment: 7 drinks per week per pt  . Drug use: No  . Sexual activity: Yes    Birth control/protection: I.U.D.  Lifestyle  . Physical activity:    Days per week: Not on file    Minutes per session: Not on file  . Stress: Not on file  Relationships  . Social connections:    Talks on phone: Not on file    Gets together: Not on file    Attends religious service: Not on file    Active member of club or organization: Not on file    Attends meetings of clubs or organizations: Not on file    Relationship status: Not on file  Other Topics Concern  . 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   Physical Exam  Nursing note and vitals reviewed. Constitutional: She is oriented to person, place, and time. She appears well-developed and well-nourished. She does not appear ill. No distress.  HENT:  Head: Normocephalic and atraumatic.  Right Ear: Tympanic membrane, external ear and ear canal normal.  Left Ear: Tympanic membrane, external ear and ear canal normal.  Nose: Rhinorrhea present. Right sinus exhibits no frontal sinus tenderness. Left sinus exhibits no frontal sinus tenderness.  Mouth/Throat: Oropharynx is clear and moist and mucous membranes are normal.  Mild tenderness upon pressing maxillary sinuses. Normal sinus transillumination. Postnasal drainage.  Eyes: Conjunctivae are normal.  Cardiovascular: Normal rate and regular rhythm.  No murmur heard. Respiratory: Effort normal and breath sounds normal. No respiratory distress.  Lymphadenopathy:       Head (right side): No submandibular adenopathy present.       Head (left side): No submandibular adenopathy present.    She has no cervical adenopathy.  Neurological: She is alert and oriented to person, place, and time.  She has normal strength.  Skin: Skin is warm. No rash noted. No erythema.  Psychiatric: Her speech is normal. Her mood appears anxious.  Well groomed, good eye contact.      ASSESSMENT AND PLAN:   Ms. Tamella was seen today for cough and sinus congestion and drainage.  Diagnoses and all orders for this visit:  Asthma, intermittent, uncomplicated Today no wheezing. Side effects of Prednisone discussed. Albuterol inh 2 puff every 6 hours for a week then as needed for wheezing or shortness of breath.  Instructed about warning signs.  -     predniSONE (DELTASONE) 20 MG tablet; Take 2 tablets (40 mg total) by mouth daily with breakfast for 5 days.  Nasal sinus congestion Nasal irrigations with saline several times per day. I do not think abx is needed at this time, Rx sent and instructed to start  Doxycycline if she is not feeling any better in 3-4 days. Prednisone may help.  Cough, persistent She is afraid of symptoms being related to "cancer."  Lung auscultation negative today. Reassured about a serious process. I reviewed CXR and negative for infiltrates,negative.  -     DG Chest 2 View; Future  Other orders -     doxycycline (VIBRA-TABS) 100 MG tablet; Take 1 tablet (100 mg total) by mouth 2 (two) times daily for 7 days.     Return if symptoms worsen or fail to improve.     Irelynn Schermerhorn G. Martinique, MD  [facility]. [place] office.

## 2018-07-18 ENCOUNTER — Encounter: Payer: Self-pay | Admitting: Family Medicine

## 2018-07-20 ENCOUNTER — Encounter: Payer: Self-pay | Admitting: Family Medicine

## 2018-07-21 ENCOUNTER — Encounter: Payer: PRIVATE HEALTH INSURANCE | Admitting: Family Medicine

## 2018-07-21 DIAGNOSIS — Z0289 Encounter for other administrative examinations: Secondary | ICD-10-CM

## 2018-08-08 ENCOUNTER — Encounter: Payer: PRIVATE HEALTH INSURANCE | Admitting: Family Medicine

## 2019-06-25 DIAGNOSIS — Z30433 Encounter for removal and reinsertion of intrauterine contraceptive device: Secondary | ICD-10-CM | POA: Diagnosis not present

## 2019-09-16 DIAGNOSIS — Z30431 Encounter for routine checking of intrauterine contraceptive device: Secondary | ICD-10-CM | POA: Diagnosis not present

## 2020-04-01 DIAGNOSIS — Z23 Encounter for immunization: Secondary | ICD-10-CM | POA: Diagnosis not present

## 2020-04-01 DIAGNOSIS — Z1151 Encounter for screening for human papillomavirus (HPV): Secondary | ICD-10-CM | POA: Diagnosis not present

## 2020-04-01 DIAGNOSIS — Z6827 Body mass index (BMI) 27.0-27.9, adult: Secondary | ICD-10-CM | POA: Diagnosis not present

## 2020-04-01 DIAGNOSIS — Z1231 Encounter for screening mammogram for malignant neoplasm of breast: Secondary | ICD-10-CM | POA: Diagnosis not present

## 2020-04-01 DIAGNOSIS — F321 Major depressive disorder, single episode, moderate: Secondary | ICD-10-CM | POA: Diagnosis not present

## 2020-04-01 DIAGNOSIS — Z01419 Encounter for gynecological examination (general) (routine) without abnormal findings: Secondary | ICD-10-CM | POA: Diagnosis not present

## 2020-04-01 DIAGNOSIS — Z01411 Encounter for gynecological examination (general) (routine) with abnormal findings: Secondary | ICD-10-CM | POA: Diagnosis not present

## 2020-05-21 IMAGING — DX DG CHEST 2V
2 series · 2 of 2 positions shown · non-contrast
Comparison: None.

CLINICAL DATA: Cough for 4 weeks.

EXAM:
CHEST - 2 VIEW

[chest pa]
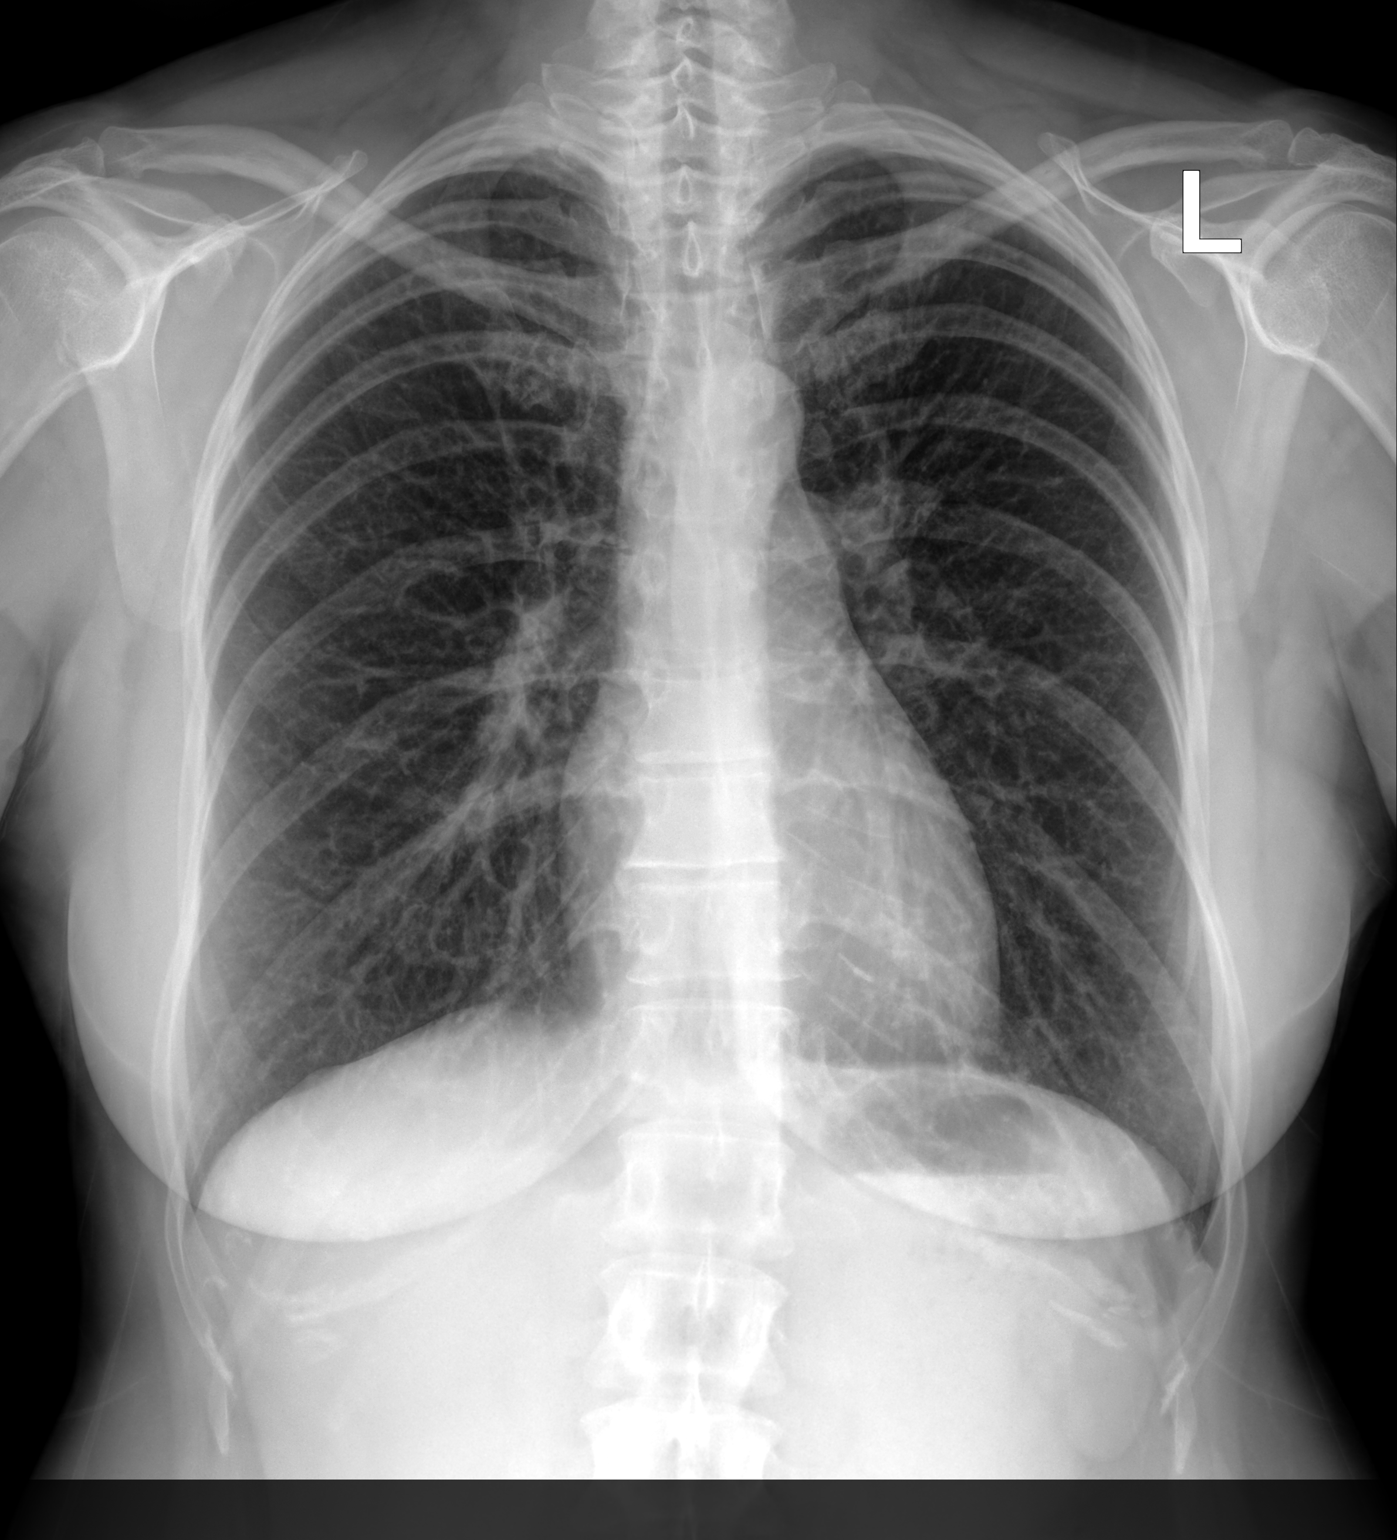

[chest lat]
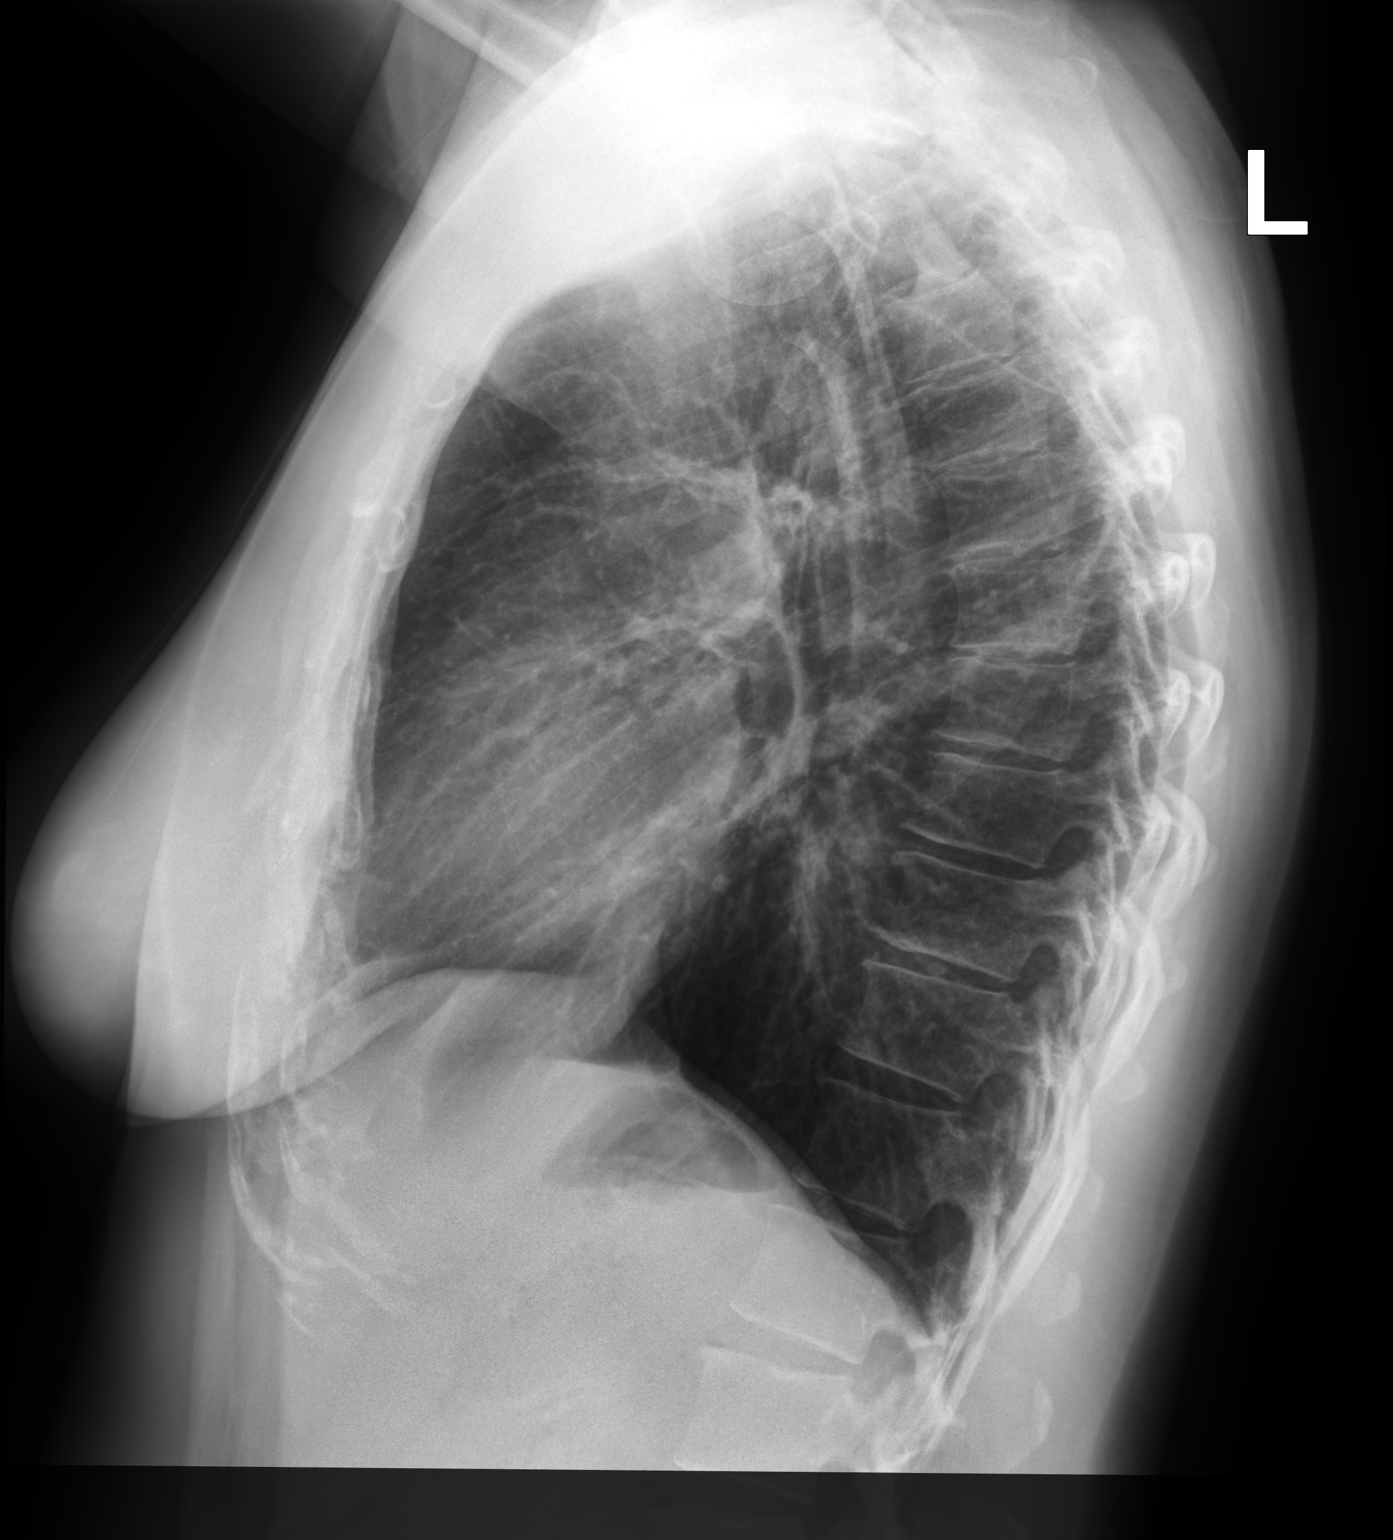

[2 of 2 positions shown; findings below may reference images not displayed]

FINDINGS: The heart size and mediastinal contours are within normal limits.
Both lungs are clear. The visualized skeletal structures are
unremarkable.
IMPRESSION: Negative two view chest x-ray

## 2021-02-02 DIAGNOSIS — R309 Painful micturition, unspecified: Secondary | ICD-10-CM | POA: Diagnosis not present

## 2021-02-23 ENCOUNTER — Ambulatory Visit: Payer: Federal, State, Local not specified - PPO | Admitting: Internal Medicine

## 2021-02-23 ENCOUNTER — Encounter: Payer: Self-pay | Admitting: Internal Medicine

## 2021-02-23 ENCOUNTER — Other Ambulatory Visit: Payer: Self-pay

## 2021-02-23 VITALS — BP 120/80 | HR 78 | Temp 98.1°F | Wt 174.7 lb

## 2021-02-23 DIAGNOSIS — L509 Urticaria, unspecified: Secondary | ICD-10-CM

## 2021-02-23 MED ORDER — PREDNISONE 10 MG (21) PO TBPK: ORAL_TABLET | ORAL | 0 refills | Status: DC

## 2021-02-23 NOTE — Progress Notes (Signed)
Acute office Visit     This visit occurred during the SARS-CoV-2 public health emergency.  Safety protocols were in place, including screening questions prior to the visit, additional usage of staff PPE, and extensive cleaning of exam room while observing appropriate contact time as indicated for disinfecting solutions.    CC/Reason for Visit: Facial rash  HPI: Mary Berg is a 45 y.o. female who is coming in today for the above mentioned reasons.  She is here today with an approximate 36-hour onset of a facial rash.  This started a day after she used a new facial product.  She is unable to tell me the ingredients of this facial product.  She does tell me however that she has had hives multiple times throughout her life and is ready to see an allergist.  No fever, tongue or throat swelling, difficulty swallowing.  Rash is very pruritic.  Past Medical/Surgical History: Past Medical History:  Diagnosis Date   Anemia    Anxiety    DM (diabetes mellitus), gestational    Migraine headache     Past Surgical History:  Procedure Laterality Date   CESAREAN SECTION     primary uncomplicated in [id number]   COLONOSCOPY     TAB     history of one uncomplicated TAB in [id number]   TONSILLECTOMY      Social History:  reports that she has quit smoking. Her smoking use included cigarettes. She has a 10.00 pack-year smoking history. She has never used smokeless tobacco. She reports current alcohol use. She reports that she does not use drugs.  Allergies: Allergies  Allergen Reactions   Azithromycin Anaphylaxis and Hives   Erythromycin     REACTION: sensitive stomach    Family History:  Family History  Problem Relation Age of Onset   Nephrolithiasis Other    Arthritis Other    Hypertension Other    Coronary artery disease Other        no premature in immediate family   Skin cancer Maternal Grandmother        Squamous cell   Colon polyps Mother    Breast cancer Paternal Aunt     Diabetes Paternal Aunt    Heart disease Paternal Grandfather    Vaginal cancer Paternal Aunt    Colon cancer Neg Hx      Current Outpatient Medications:    albuterol (PROVENTIL HFA;VENTOLIN HFA) 108 (90 Base) MCG/ACT inhaler, INHALE 2 PUFFS INTO THE LUNGS EVERY 6 HOURS AS NEEDED FOR WHEEZING OR SHORTNESS OF BREATH, Disp: 6.7 g, Rfl: 0   ALPRAZolam (XANAX) 0.25 MG tablet, Take 1 tablet (0.25 mg total) by mouth daily as needed for anxiety., Disp: 30 tablet, Rfl: 0   diphenhydrAMINE (BENADRYL) 25 MG tablet, Take 1 tablet (25 mg total) by mouth every 6 (six) hours as needed for itching or allergies., Disp: 20 tablet, Rfl: 0   Ibuprofen (MOTRIN IB PO), Take 200 mg by mouth as needed (pain). , Disp: , Rfl:    Multiple Vitamin (MULTIVITAMIN) capsule, Take 1 capsule daily by mouth., Disp: , Rfl:    predniSONE (STERAPRED UNI-PAK 21 TAB) 10 MG (21) TBPK tablet, Take as directed, Disp: 21 tablet, Rfl: 0   venlafaxine XR (EFFEXOR-XR) 37.5 MG 24 hr capsule, Take 37.5 mg by mouth daily., Disp: , Rfl:   Current Facility-Administered Medications:    0.9 %  sodium chloride infusion, 500 mL, Intravenous, Continuous, Pyrtle, Lajuan Lines, MD  Review of Systems:  Constitutional: Denies fever,  chills, diaphoresis, appetite change and fatigue.  HEENT: Denies photophobia, eye pain, redness, hearing loss, ear pain, congestion, sore throat, rhinorrhea, sneezing, mouth sores, trouble swallowing, neck pain, neck stiffness and tinnitus.   Respiratory: Denies SOB, DOE, cough, chest tightness,  and wheezing.   Cardiovascular: Denies chest pain, palpitations and leg swelling.  Gastrointestinal: Denies nausea, vomiting, abdominal pain, diarrhea, constipation, blood in stool and abdominal distention.  Genitourinary: Denies dysuria, urgency, frequency, hematuria, flank pain and difficulty urinating.  Endocrine: Denies: hot or cold intolerance, sweats, changes in hair or nails, polyuria, polydipsia. Musculoskeletal: Denies  myalgias, back pain, joint swelling, arthralgias and gait problem.  Skin: Denies pallor and wound.  Neurological: Denies dizziness, seizures, syncope, weakness, light-headedness, numbness and headaches.  Hematological: Denies adenopathy. Easy bruising, personal or family bleeding history  Psychiatric/Behavioral: Denies suicidal ideation, mood changes, confusion, nervousness, sleep disturbance and agitation    Physical Exam: Vitals:   02/23/21 1034  BP: 120/80  Pulse: 78  Temp: 98.1 F (36.7 C)  TempSrc: Oral  SpO2: 98%  Weight: 174 lb 11.2 oz (79.2 kg)    Body mass index is 29.07 kg/m.   Constitutional: NAD, calm, comfortable Eyes: PERRL, lids and conjunctivae normal ENMT: Mucous membranes are moist.  Skin: Multiple large macules over her face and neck with irregular borders Neurologic: Grossly intact and nonfocal Psychiatric: Normal judgment and insight. Alert and oriented x 3. Normal mood.    Impression and Plan:  Hives  - Plan: predniSONE (STERAPRED UNI-PAK 21 TAB) 10 MG (21) TBPK tablet -Have advised she suspend use of new facial moisturizer for now, I will initiate allergy referral.     Bevin Mayall Isaac Bliss, MD [facility]

## 2021-04-03 ENCOUNTER — Encounter: Payer: Self-pay | Admitting: Family Medicine

## 2021-04-03 DIAGNOSIS — Z1231 Encounter for screening mammogram for malignant neoplasm of breast: Secondary | ICD-10-CM | POA: Diagnosis not present

## 2021-04-03 DIAGNOSIS — Z0000 Encounter for general adult medical examination without abnormal findings: Secondary | ICD-10-CM | POA: Diagnosis not present

## 2021-04-03 DIAGNOSIS — Z1329 Encounter for screening for other suspected endocrine disorder: Secondary | ICD-10-CM | POA: Diagnosis not present

## 2021-04-03 DIAGNOSIS — Z13220 Encounter for screening for lipoid disorders: Secondary | ICD-10-CM | POA: Diagnosis not present

## 2021-04-03 DIAGNOSIS — Z6828 Body mass index (BMI) 28.0-28.9, adult: Secondary | ICD-10-CM | POA: Diagnosis not present

## 2021-04-03 DIAGNOSIS — Z01419 Encounter for gynecological examination (general) (routine) without abnormal findings: Secondary | ICD-10-CM | POA: Diagnosis not present

## 2021-04-03 DIAGNOSIS — Z131 Encounter for screening for diabetes mellitus: Secondary | ICD-10-CM | POA: Diagnosis not present

## 2021-05-12 ENCOUNTER — Encounter: Payer: Self-pay | Admitting: Allergy

## 2021-05-12 ENCOUNTER — Ambulatory Visit: Payer: Federal, State, Local not specified - PPO | Admitting: Allergy

## 2021-05-12 ENCOUNTER — Other Ambulatory Visit: Payer: Self-pay

## 2021-05-12 VITALS — BP 118/72 | HR 88 | Temp 98.7°F | Resp 16 | Ht 65.0 in | Wt 175.2 lb

## 2021-05-12 DIAGNOSIS — J709 Respiratory conditions due to unspecified external agent: Secondary | ICD-10-CM | POA: Diagnosis not present

## 2021-05-12 DIAGNOSIS — T50905D Adverse effect of unspecified drugs, medicaments and biological substances, subsequent encounter: Secondary | ICD-10-CM

## 2021-05-12 DIAGNOSIS — L509 Urticaria, unspecified: Secondary | ICD-10-CM

## 2021-05-12 DIAGNOSIS — J3089 Other allergic rhinitis: Secondary | ICD-10-CM | POA: Diagnosis not present

## 2021-05-12 DIAGNOSIS — J4540 Moderate persistent asthma, uncomplicated: Secondary | ICD-10-CM | POA: Diagnosis not present

## 2021-05-12 DIAGNOSIS — T887XXA Unspecified adverse effect of drug or medicament, initial encounter: Secondary | ICD-10-CM

## 2021-05-12 DIAGNOSIS — H1013 Acute atopic conjunctivitis, bilateral: Secondary | ICD-10-CM | POA: Diagnosis not present

## 2021-05-12 MED ORDER — RYALTRIS 665-25 MCG/ACT NA SUSP: 2.0000 | Freq: Two times a day (BID) | NASAL | 5 refills | Status: AC

## 2021-05-12 MED ORDER — ALBUTEROL SULFATE HFA 108 (90 BASE) MCG/ACT IN AERS: INHALATION_SPRAY | RESPIRATORY_TRACT | 1 refills | Status: DC

## 2021-05-12 MED ORDER — FLUTICASONE PROPIONATE HFA 110 MCG/ACT IN AERO: 2.0000 | INHALATION_SPRAY | Freq: Two times a day (BID) | RESPIRATORY_TRACT | 5 refills | Status: DC

## 2021-05-12 NOTE — Progress Notes (Signed)
New Patient Note  RE: Mary Berg MRN: [id number] DOB: Oct 02, 1975 Date of Office Visit: 05/12/2021  Referring provider: Isaac Bliss, [place] [facility]* Primary care provider: Martinique, Betty G, MD  Chief Complaint: rash  History of present illness: Mary Berg is a 45 y.o. female presenting today for consultation for rash and allergies.    She states a few months ago she had a rash on her face that was "sort of" hive like.  The rash cleared within a week.  Rash did not leave any marks/bruisings behind.  No swelling.  No fevers.   No joint aches/pain.  She states this is about the 3rd or 4th time it has open over about 10 years.  This most recent episode was treated with prednisone.  The rash is itchy.  The most recent rash she states it look like small red bumps.  Previous episodes have been more welt-like.  2 of the episodes that she can remember were both in fall/winter.  With most recent episodes no preceding illnesses.  She states she did use a new face product she had started and stopped using with the rash.  No new meds/supplements prior.  No new foods. No stings/bites.   She states she is allergic to her dog.  Has had eye swelling, itchy/watery eyes and wheezing/cough at home around her dog.  She states she has had constant nasal congestion and drainage with throat clearing, sneezing.  She states she is allergic to the pets in general and states symptoms related to horses worse than cats that's worse than dogs.  She has had albuterol inhaler that does help and using it twice a day over the last 2-3 weeks for wheeze symptoms.  Will take benadryl as needed when symptoms are really bad.  She has not taken a long-acting antihistamine on a regular basis.  Has not tried use of nasal sprays or eye drops.   With the respiratory symptoms she Denies exercise intolerance.   She has no prior history of asthma.  She reports an allergy to azithromcyin.   She states on second course of this she developed hives  all over her body.  This was about 5 years ago.   Review of systems in the past 4 weeks: Review of Systems  Constitutional: Negative.   HENT:  Positive for congestion, postnasal drip and sneezing.   Eyes:  Positive for itching.  Respiratory:  Positive for cough and wheezing.   Cardiovascular: Negative.   Gastrointestinal: Negative.   Musculoskeletal: Negative.   Skin: Negative.   Allergic/Immunologic: Negative.   Neurological: Negative.    All other systems negative unless noted above in HPI  Past medical history: Past Medical History:  Diagnosis Date   Anemia    Anxiety    DM (diabetes mellitus), gestational    Migraine headache    Urticaria     Past surgical history: Past Surgical History:  Procedure Laterality Date   CESAREAN SECTION     primary uncomplicated in 0258   COLONOSCOPY     TAB     history of one uncomplicated TAB in 5277   TONSILLECTOMY      Family history:  Family History  Problem Relation Age of Onset   Nephrolithiasis Other    Arthritis Other    Hypertension Other    Coronary artery disease Other        no premature in immediate family   Skin cancer Maternal Grandmother        Squamous cell  Colon polyps Mother    Breast cancer Paternal Aunt    Diabetes Paternal Aunt    Heart disease Paternal Grandfather    Vaginal cancer Paternal Aunt    Colon cancer Neg Hx     Social history: Lives in a home with carpeting in the bedroom with gas heating and central cooling.  Dog in the home.  There is concern for water damage or mildew in the home.  There is no concern for roaches in the home.  She is an Forensic psychologist.  She is a former smoker from [id number] smoking 1 pack of cigarettes per day.   Medication List: Current Outpatient Medications  Medication Sig Dispense Refill   albuterol (VENTOLIN HFA) 108 (90 Base) MCG/ACT inhaler 2 inhalations every 4-6 hours as needed 18 g 1   diphenhydrAMINE (BENADRYL) 25 MG tablet Take 1 tablet (25 mg total) by  mouth every 6 (six) hours as needed for itching or allergies. 20 tablet 0   fluticasone (FLOVENT HFA) 110 MCG/ACT inhaler Inhale 2 puffs into the lungs 2 (two) times daily. Use with spacer device. 1 each 5   Ibuprofen (MOTRIN IB PO) Take 200 mg by mouth as needed (pain).      RYALTRIS [id number] MCG/ACT SUSP Place 2 sprays into the nose 2 (two) times daily. 29 g 5   venlafaxine XR (EFFEXOR-XR) 37.5 MG 24 hr capsule Take 37.5 mg by mouth daily.     Current Facility-Administered Medications  Medication Dose Route Frequency Provider Last Rate Last Admin   0.9 %  sodium chloride infusion  500 mL Intravenous Continuous Pyrtle, Lajuan Lines, MD        Known medication allergies: Allergies  Allergen Reactions   Azithromycin Anaphylaxis and Hives   Erythromycin     REACTION: sensitive stomach     Physical examination: Blood pressure 118/72, pulse 88, temperature 98.7 F (37.1 C), temperature source Temporal, resp. rate 16, height 5\' 5"  (1.651 m), weight 175 lb 3.2 oz (79.5 kg), SpO2 97 %.  General: Alert, interactive, in no acute distress. HEENT: PERRLA, TMs pearly gray, turbinates minimally edematous without discharge, post-pharynx non erythematous. Neck: Supple without lymphadenopathy. Lungs: Clear to auscultation without wheezing, rhonchi or rales. {no increased work of breathing. CV: Normal S1, S2 without murmurs. Abdomen: Nondistended, nontender. Skin: Warm and dry, without lesions or rashes. Extremities:  No clubbing, cyanosis or edema. Neuro:   Grossly intact.  Diagnositics/Labs: Spirometry: FEV1: 2.29 L 77%, FVC: 3.16 L 86% predicted.  Status post Xopenex she had an increase in FEV1 to 2.32 L or 78% which is not a significant response  Allergy testing:   Airborne Adult Perc - 05/12/21 1038     Time Antigen Placed 1038    Allergen Manufacturer Lavella Hammock    Location Back    Number of Test 59    Panel 1 Select    1. Control-Buffer 50% Glycerol Negative    2. Control-Histamine 1 mg/ml 2+     3. Albumin saline Negative    4. Corry 4+    5. [place] 4+    6. Johnson 4+    7. Roslyn 4+    8. Meadow Fescue 4+    9. Perennial Rye 4+    10. Sweet Vernal 4+    11. Timothy 4+    12. Cocklebur 3+    13. Burweed Marshelder 3+    14. Ragweed, short 3+    15. Ragweed, Giant 3+    16. Plantain,  English 4+  17. Lamb's Quarters 4+    18. Sheep Sorrell 3+    19. Rough Pigweed 4+    20. Marsh Elder, Rough 3+    21. Mugwort, Common 2+    22. Ash mix 4+    23. Birch mix 4+    24. Beech American 3+    25. Box, Elder 2+    26. Cedar, red Negative    27. Cottonwood, Eastern 3+    28. Elm mix 2+    29. Hickory 3+    30. Maple mix 2+    31. Oak, [place] mix 4+    32. Pecan Pollen 3+    33. Pine mix Negative    34. Sycamore Eastern 4+    35. [place], Black Pollen 3+    36. Alternaria alternata 3+    37. Cladosporium Herbarum Negative    38. Aspergillus mix Negative    39. Penicillium mix Negative    40. Bipolaris sorokiniana (Helminthosporium) Negative    41. Drechslera spicifera (Curvularia) Negative    42. Mucor plumbeus Negative    43. Fusarium moniliforme Negative    44. Aureobasidium pullulans (pullulara) Negative    45. Rhizopus oryzae Negative    46. Botrytis cinera 2+    47. Epicoccum nigrum Negative    48. Phoma betae 2+    49. Candida Albicans Negative    50. Trichophyton mentagrophytes Negative    51. Mite, D Farinae  5,000 AU/ml 4+    52. Mite, D Pteronyssinus  5,000 AU/ml 4+    53. Cat Hair 10,000 BAU/ml 4+    54.  Dog Epithelia 2+    55. Mixed Feathers Negative    56. Horse Epithelia 2+    57. Cockroach, German Negative    58. Mouse Negative    59. Tobacco Leaf Negative             Allergy testing results were read and interpreted by provider, documented by clinical staff.   Assessment and plan: Allergic rhinitis conjunctivitis   - Testing today showed: grasses, weeds, trees, outdoor molds, dust mites, cat, dog, and horse . - Copy of  test results provided.  - Avoidance measures provided. - For allergy symptom control can use the following:  Allegra (fexofenadine) 180mg  table once daily for general allergy symptom control Ryaltris (olopatadine/mometasone) two sprays per nostril 1-2 times daily as needed for nasal congestion and drainage control Pataday (olopatadine) one drop per eye twice daily as needed for itchy/watery/swelling eyes - Allergy shots (immunotherapy) as a means of long-term control. - Allergy shots "re-train" and "reset" the immune system to ignore environmental allergens and decrease the resulting immune response to those allergens (sneezing, itchy watery eyes, runny nose, nasal congestion, etc).    - Allergy shots improve symptoms in ~85% of patients.  Informational handout provided.  You can check with your insurance carrier for coverage.  Let us know if you would like to proceed with this option.    Reactive airway, allergen driven -have access to albuterol inhaler 2 puffs every 4-6 hours as needed for cough/wheeze/shortness of breath/chest tightness.  May use 15-20 minutes prior to activity.   Monitor frequency of use.   -start Flovent 143mcg 2 puffs twice a day with spacer device -Once respiratory symptoms are under good control she would benefit from allergy shots as above decrease symptoms related to dog exposure  -Breathing control goals:  Full participation in all desired activities (may need albuterol before activity) Albuterol use two time or  less a week on average (not counting use with activity) Cough interfering with sleep two time or less a month Oral steroids no more than once a year No hospitalizations  Urticarial dermatitis -at this time etiology of hives and hive-like is unknown.  Hives can be caused by a variety of different triggers including illness/infection, foods, medications, stings, exercise, pressure, vibrations, extremes of temperature to name a few however majority of the time  there is no identifiable trigger.  Your symptoms have been ongoing for >6 weeks making this chronic thus will obtain labwork to evaluate: CBC w diff, CMP, tryptase, hive panel, alpha-gal panel -hive-like rashes are best controlled with high-dose antihistamine regimens which consist of combination of H1 (ie. Allegra) and H2 blocker (ie. Pepcid).   You can do Allegra 1-2 tabs a day with Pepcid 1-2 tabs a day during hive episodes  Adverse medication reaction -continue avoidance of azithromycin.  We can tackle this medication allergy in the future  Follow-up in 3 months or sooner if needed  I appreciate the opportunity to take part in [facility] care. Please do not hesitate to contact me with questions.  Sincerely,   Prudy Feeler, MD Allergy/Immunology [facility]

## 2021-05-12 NOTE — Patient Instructions (Addendum)
-   Testing today showed: grasses, weeds, trees, outdoor molds, dust mites, cat, dog, and horse . - Copy of test results provided.  - Avoidance measures provided. - For allergy symptom control can use the following:  Allegra (fexofenadine) 180mg  table once daily for general allergy symptom control Ryaltris (olopatadine/mometasone) two sprays per nostril 1-2 times daily as needed for nasal congestion and drainage control Pataday (olopatadine) one drop per eye twice daily as needed for itchy/watery/swelling eyes - Allergy shots (immunotherapy) as a means of long-term control. - Allergy shots "re-train" and "reset" the immune system to ignore environmental allergens and decrease the resulting immune response to those allergens (sneezing, itchy watery eyes, runny nose, nasal congestion, etc).    - Allergy shots improve symptoms in ~85% of patients.  Informational handout provided.  You can check with your insurance carrier for coverage.  Let us know if you would like to proceed with this option.    -have access to albuterol inhaler 2 puffs every 4-6 hours as needed for cough/wheeze/shortness of breath/chest tightness.  May use 15-20 minutes prior to activity.   Monitor frequency of use.   -start Flovent 124mcg 2 puffs twice a day with spacer device  -Breathing control goals:  Full participation in all desired activities (may need albuterol before activity) Albuterol use two time or less a week on average (not counting use with activity) Cough interfering with sleep two time or less a month Oral steroids no more than once a year No hospitalizations  -at this time etiology of hives and hive-like is unknown.  Hives can be caused by a variety of different triggers including illness/infection, foods, medications, stings, exercise, pressure, vibrations, extremes of temperature to name a few however majority of the time there is no identifiable trigger.  Your symptoms have been ongoing for >6 weeks making  this chronic thus will obtain labwork to evaluate: CBC w diff, CMP, tryptase, hive panel, alpha-gal panel -hive-like rashes are best controlled with high-dose antihistamine regimens which consist of combination of H1 (ie. Allegra) and H2 blocker (ie. Pepcid).   You can do Allegra 1-2 tabs a day with Pepcid 1-2 tabs a day during hive episodes  -continue avoidance of azithromycin.  We can tackle this medication allergy in the future  Follow-up in 3 months or sooner if needed

## 2021-05-19 LAB — CHRONIC URTICARIA: cu index: 1.1 (ref <10)

## 2021-05-19 LAB — COMPREHENSIVE METABOLIC PANEL
ALT: 15 IU/L (ref 0–32)
AST: 18 IU/L (ref 0–40)
Albumin/Globulin Ratio: 2.8 — ABNORMAL HIGH (ref 1.2–2.2)
Albumin: 5.4 g/dL — ABNORMAL HIGH (ref 3.8–4.8)
Alkaline Phosphatase: 66 IU/L (ref 44–121)
BUN/Creatinine Ratio: 19 (ref 9–23)
BUN: 14 mg/dL (ref 6–24)
Bilirubin Total: 0.3 mg/dL (ref 0.0–1.2)
CO2: 23 mmol/L (ref 20–29)
Calcium: 10.3 mg/dL — ABNORMAL HIGH (ref 8.7–10.2)
Chloride: 102 mmol/L (ref 96–106)
Creatinine, Ser: 0.74 mg/dL (ref 0.57–1.00)
Globulin, Total: 1.9 g/dL (ref 1.5–4.5)
Glucose: 96 mg/dL (ref 70–99)
Potassium: 4.3 mmol/L (ref 3.5–5.2)
Sodium: 140 mmol/L (ref 134–144)
Total Protein: 7.3 g/dL (ref 6.0–8.5)
eGFR: 101 mL/min/{1.73_m2} (ref >59)

## 2021-05-19 LAB — CBC WITH DIFFERENTIAL
Basophils Absolute: 0.1 10*3/uL (ref 0.0–0.2)
Basos: 1 %
EOS (ABSOLUTE): 0.5 10*3/uL — ABNORMAL HIGH (ref 0.0–0.4)
Eos: 7 %
Hematocrit: 42.3 % (ref 34.0–46.6)
Hemoglobin: 14 g/dL (ref 11.1–15.9)
Immature Grans (Abs): 0 10*3/uL (ref 0.0–0.1)
Immature Granulocytes: 0 %
Lymphocytes Absolute: 1.8 10*3/uL (ref 0.7–3.1)
Lymphs: 28 %
MCH: 30.5 pg (ref 26.6–33.0)
MCHC: 33.1 g/dL (ref 31.5–35.7)
MCV: 92 fL (ref 79–97)
Monocytes Absolute: 0.5 10*3/uL (ref 0.1–0.9)
Monocytes: 8 %
Neutrophils Absolute: 3.6 10*3/uL (ref 1.4–7.0)
Neutrophils: 56 %
RBC: 4.59 x10E6/uL (ref 3.77–5.28)
RDW: 11.9 % (ref 11.7–15.4)
WBC: 6.5 10*3/uL (ref 3.4–10.8)

## 2021-05-19 LAB — ALPHA-GAL PANEL
Allergen Lamb IgE: <0.1 kU/L
Beef IgE: <0.1 kU/L
IgE (Immunoglobulin E), Serum: 295 IU/mL (ref 6–495)
O215-IgE Alpha-Gal: <0.1 kU/L
Pork IgE: <0.1 kU/L

## 2021-05-19 LAB — THYROID ANTIBODIES
Thyroglobulin Antibody: <1 IU/mL (ref 0.0–0.9)
Thyroperoxidase Ab SerPl-aCnc: <9 IU/mL (ref 0–34)

## 2021-05-19 LAB — TRYPTASE: Tryptase: 5.6 ug/L (ref 2.2–13.2)

## 2021-06-14 ENCOUNTER — Other Ambulatory Visit: Payer: Self-pay | Admitting: Allergy

## 2021-08-18 ENCOUNTER — Encounter: Payer: Self-pay | Admitting: Allergy

## 2021-08-18 ENCOUNTER — Other Ambulatory Visit: Payer: Self-pay

## 2021-08-18 ENCOUNTER — Ambulatory Visit: Payer: Federal, State, Local not specified - PPO | Admitting: Allergy

## 2021-08-18 VITALS — BP 140/76 | HR 77 | Temp 98.0°F | Resp 16 | Ht 65.0 in | Wt 159.4 lb

## 2021-08-18 DIAGNOSIS — L509 Urticaria, unspecified: Secondary | ICD-10-CM

## 2021-08-18 DIAGNOSIS — H1013 Acute atopic conjunctivitis, bilateral: Secondary | ICD-10-CM | POA: Diagnosis not present

## 2021-08-18 DIAGNOSIS — J4540 Moderate persistent asthma, uncomplicated: Secondary | ICD-10-CM | POA: Diagnosis not present

## 2021-08-18 DIAGNOSIS — J3089 Other allergic rhinitis: Secondary | ICD-10-CM

## 2021-08-18 DIAGNOSIS — T50905D Adverse effect of unspecified drugs, medicaments and biological substances, subsequent encounter: Secondary | ICD-10-CM

## 2021-08-18 NOTE — Patient Instructions (Addendum)
-   Continue avoidance measures for grasses, weeds, trees, outdoor molds, dust mites, cat, dog, and horse. ?- For allergy symptom control continue the following:  ?Allegra (fexofenadine) '180mg'$  table once daily as needed for general allergy symptom control. ?Ryaltris (olopatadine/mometasone) two sprays per nostril 1-2 times daily as needed for nasal congestion and drainage control. ?Pataday (olopatadine) one drop per eye twice daily as needed for itchy/watery/swelling eyes. ?- Consider allergy shots (immunotherapy) as a means of long-term control if medication management is not effective enough.  Allergy shots "re-train" and "reset" the immune system to ignore environmental allergens and decrease the resulting immune response to those allergens (sneezing, itchy watery eyes, runny nose, nasal congestion, etc).  Allergy shots improve symptoms in ~85% of patients.   ? ?- Have access to albuterol inhaler 2 puffs every 4-6 hours as needed for cough/wheeze/shortness of breath/chest tightness.  May use 15-20 minutes prior to activity.   Monitor frequency of use.   ?- Continue Flovent 159mg 2 puffs twice a day with spacer device ? ?-Breathing control goals:  ?Full participation in all desired activities (may need albuterol before activity) ?Albuterol use two time or less a week on average (not counting use with activity) ?Cough interfering with sleep two time or less a month ?Oral steroids no more than once a year ?No hospitalizations ? ?- Etiology of hives and hive-like is spontaneous.  Lab work-up was reassuring.   Hives can be caused by a variety of different triggers including illness/infection, foods, medications, stings, exercise, pressure, vibrations, extremes of temperature to name a few however majority of the time there is no identifiable trigger.   ?- Hive-like rashes are best controlled with high-dose antihistamine regimens which consist of combination of H1 (ie. Allegra) and H2 blocker (ie. Pepcid).   You can do  Allegra 1-2 tabs a day with Pepcid 1-2 tabs a day during hive episodes ? ?- Continue avoidance of azithromycin.  We can tackle this medication allergy in the future. ? ?Follow-up in 6 months or sooner if needed ? ? ? ?

## 2021-08-18 NOTE — Progress Notes (Signed)
Follow-up Note  RE: Mary Berg MRN: [id number] DOB: 02/12/1976 Date of Office Visit: 08/18/2021   History of present illness: Mary Berg is a 46 y.o. female presenting today for follow-up of allergic rhinitis with conjunctivitis, reactive airway, urticaria.  She was last seen in the office on 05/12/21 by myself.  She states she has been doing better since her last visit.  She states since starting the Flovent she is only needed to use albuterol about 4 times per chest tightness.  She is doing the medium dose Flovent 2 puffs twice a day with spacer device.  She states she is less reactive around her dog.  She has not required any ED or urgent care visits or systemic steroid needs. In regards to her allergy symptoms she states this is doing a bit better as well.  She did start giving Allegra daily over the past 2 weeks.  She did try use of the nasal spray Ryaltris and uses this as needed.  She states it did help when used for nasal congestion or drainage control.  She has not needed to use the eyedrop Pataday. She has not had any hives since the last visit. She does avoid azithromycin medication.  Review of systems: Review of Systems  Constitutional: Negative.   HENT: Negative.    Eyes: Negative.   Respiratory:  Positive for chest tightness.   Cardiovascular: Negative.   Gastrointestinal: Negative.   Musculoskeletal: Negative.   Skin: Negative.   Allergic/Immunologic: Negative.   Neurological: Negative.     All other systems negative unless noted above in HPI  Past medical/social/surgical/family history have been reviewed and are unchanged unless specifically indicated below.  No changes  Medication List: Current Outpatient Medications  Medication Sig Dispense Refill   albuterol (VENTOLIN HFA) 108 (90 Base) MCG/ACT inhaler INHALE 2 INHALATIONS BY MOUTH EVERY 4 TO 6 HOURS AS NEEDED 18 g 1   Fexofenadine HCl (ALLEGRA ALLERGY PO) Take 1 tablet by mouth daily.     fluticasone  (FLOVENT HFA) 110 MCG/ACT inhaler Inhale 2 puffs into the lungs 2 (two) times daily. Use with spacer device. 1 each 5   Ibuprofen (MOTRIN IB PO) Take 200 mg by mouth as needed (pain).      Multiple Vitamin (MULTIVITAMIN ADULT PO) Take 1 tablet by mouth daily.     venlafaxine XR (EFFEXOR-XR) 37.5 MG 24 hr capsule Take 37.5 mg by mouth daily.     diphenhydrAMINE (BENADRYL) 25 MG tablet Take 1 tablet (25 mg total) by mouth every 6 (six) hours as needed for itching or allergies. (Patient not taking: Reported on 08/18/2021) 20 tablet 0   RYALTRIS 665-25 MCG/ACT SUSP Place 2 sprays into the nose 2 (two) times daily. (Patient not taking: Reported on 08/18/2021) 29 g 5   Current Facility-Administered Medications  Medication Dose Route Frequency Provider Last Rate Last Admin   0.9 %  sodium chloride infusion  500 mL Intravenous Continuous Pyrtle, Lajuan Lines, MD         Known medication allergies: Allergies  Allergen Reactions   Azithromycin Anaphylaxis and Hives   Erythromycin     REACTION: sensitive stomach     Physical examination: Blood pressure (!) 152/82, pulse 77, temperature 98 F (36.7 C), temperature source Temporal, resp. rate 16, height $RemoveBe'5\' 5"'BSbdvpNZb$  (1.651 m), weight 159 lb 6.4 oz (72.3 kg), SpO2 98 %.  General: Alert, interactive, in no acute distress. HEENT: PERRLA, TMs pearly gray, turbinates non-edematous without discharge, post-pharynx non erythematous. Neck: Supple without  lymphadenopathy. Lungs: Clear to auscultation without wheezing, rhonchi or rales. {no increased work of breathing. CV: Normal S1, S2 without murmurs. Abdomen: Nondistended, nontender. Skin: Warm and dry, without lesions or rashes. Extremities:  No clubbing, cyanosis or edema. Neuro:   Grossly intact.  Diagnositics/Labs: Labs:  Component     Latest Ref Rng & Units 05/12/2021  WBC     3.4 - 10.8 x10E3/uL 6.5  RBC     3.77 - 5.28 x10E6/uL 4.59  Hemoglobin     11.1 - 15.9 g/dL 14.0  HCT     34.0 - 46.6 % 42.3   MCV     79 - 97 fL 92  MCH     26.6 - 33.0 pg 30.5  MCHC     31.5 - 35.7 g/dL 33.1  RDW     11.7 - 15.4 % 11.9  Neutrophils     Not Estab. % 56  Lymphs     Not Estab. % 28  Monocytes     Not Estab. % 8  Eos     Not Estab. % 7  Basos     Not Estab. % 1  NEUT#     1.4 - 7.0 x10E3/uL 3.6  Lymphocyte #     0.7 - 3.1 x10E3/uL 1.8  Monocytes Absolute     0.1 - 0.9 x10E3/uL 0.5  EOS (ABSOLUTE)     0.0 - 0.4 x10E3/uL 0.5 (H)  Basophils Absolute     0.0 - 0.2 x10E3/uL 0.1  Immature Granulocytes     Not Estab. % 0  Immature Grans (Abs)     0.0 - 0.1 x10E3/uL 0.0  Glucose     70 - 99 mg/dL 96  BUN     6 - 24 mg/dL 14  Creatinine     0.57 - 1.00 mg/dL 0.74  eGFR     >59 mL/min/1.73 101  BUN/Creatinine Ratio     9 - 23 19  Sodium     134 - 144 mmol/L 140  Potassium     3.5 - 5.2 mmol/L 4.3  Chloride     96 - 106 mmol/L 102  CO2     20 - 29 mmol/L 23  Calcium     8.7 - 10.2 mg/dL 10.3 (H)  Total Protein     6.0 - 8.5 g/dL 7.3  Albumin     3.8 - 4.8 g/dL 5.4 (H)  Globulin, Total     1.5 - 4.5 g/dL 1.9  Albumin/Globulin Ratio     1.2 - 2.2 2.8 (H)  Total Bilirubin     0.0 - 1.2 mg/dL 0.3  Alkaline Phosphatase     44 - 121 IU/L 66  AST     0 - 40 IU/L 18  ALT     0 - 32 IU/L 15  Class Description Allergens      Comment  IgE (Immunoglobulin E), Serum     6 - 495 IU/mL 295  [id number] Alpha-Gal     Class 0 kU/L <0.10  Beef IgE     Class 0 kU/L <0.10  Pork IgE     Class 0 kU/L <0.10  Allergen Lamb IgE     Class 0 kU/L <0.10  Thyroperoxidase Ab SerPl-aCnc     0 - 34 IU/mL <9  Thyroglobulin Antibody     0.0 - 0.9 IU/mL <1.0  cu index     <10 1.1  Tryptase     2.2 - 13.2 ug/L 5.6  Assessment and plan: Allergic rhinitis with conjunctivitis  - Continue avoidance measures for grasses, weeds, trees, outdoor molds, dust mites, cat, dog, and horse. - For allergy symptom control continue the following:  Allegra (fexofenadine) 180mg  table once daily as  needed for general allergy symptom control. Ryaltris (olopatadine/mometasone) two sprays per nostril 1-2 times daily as needed for nasal congestion and drainage control. Pataday (olopatadine) one drop per eye twice daily as needed for itchy/watery/swelling eyes. - Consider allergy shots (immunotherapy) as a means of long-term control if medication management is not effective enough.  Allergy shots "re-train" and "reset" the immune system to ignore environmental allergens and decrease the resulting immune response to those allergens (sneezing, itchy watery eyes, runny nose, nasal congestion, etc).  Allergy shots improve symptoms in ~85% of patients.    Reactive airway, allergen driven - Have access to albuterol inhaler 2 puffs every 4-6 hours as needed for cough/wheeze/shortness of breath/chest tightness.  May use 15-20 minutes prior to activity.   Monitor frequency of use.   - Continue Flovent 18mcg 2 puffs twice a day with spacer device  -Breathing control goals:  Full participation in all desired activities (may need albuterol before activity) Albuterol use two time or less a week on average (not counting use with activity) Cough interfering with sleep two time or less a month Oral steroids no more than once a year No hospitalizations  Urticaria - Etiology of hives and hive-like is spontaneous.  Lab work-up was reassuring.   Hives can be caused by a variety of different triggers including illness/infection, foods, medications, stings, exercise, pressure, vibrations, extremes of temperature to name a few however majority of the time there is no identifiable trigger.   - Hive-like rashes are best controlled with high-dose antihistamine regimens which consist of combination of H1 (ie. Allegra) and H2 blocker (ie. Pepcid).   You can do Allegra 1-2 tabs a day with Pepcid 1-2 tabs a day during hive episodes  Adverse medication effect - Continue avoidance of azithromycin.  We can tackle this  medication allergy in the future.  Follow-up in 6 months or sooner if needed  I appreciate the opportunity to take part in [place] care. Please do not hesitate to contact me with questions.  Sincerely,   Prudy Feeler, MD Allergy/Immunology [facility]

## 2021-08-24 DIAGNOSIS — R7303 Prediabetes: Secondary | ICD-10-CM | POA: Diagnosis not present

## 2021-09-01 DIAGNOSIS — G43719 Chronic migraine without aura, intractable, without status migrainosus: Secondary | ICD-10-CM | POA: Diagnosis not present

## 2021-09-01 DIAGNOSIS — Z79899 Other long term (current) drug therapy: Secondary | ICD-10-CM | POA: Diagnosis not present

## 2021-09-01 DIAGNOSIS — G43119 Migraine with aura, intractable, without status migrainosus: Secondary | ICD-10-CM | POA: Diagnosis not present

## 2021-09-01 DIAGNOSIS — Z049 Encounter for examination and observation for unspecified reason: Secondary | ICD-10-CM | POA: Diagnosis not present

## 2021-11-24 ENCOUNTER — Other Ambulatory Visit: Payer: Self-pay | Admitting: Allergy

## 2022-02-22 ENCOUNTER — Ambulatory Visit: Payer: Federal, State, Local not specified - PPO | Admitting: Allergy

## 2022-03-06 ENCOUNTER — Other Ambulatory Visit: Payer: Self-pay | Admitting: Allergy

## 2022-03-14 ENCOUNTER — Ambulatory Visit: Payer: Federal, State, Local not specified - PPO | Admitting: Family Medicine

## 2022-03-14 ENCOUNTER — Encounter: Payer: Self-pay | Admitting: Family Medicine

## 2022-03-14 VITALS — BP 110/70 | HR 99 | Temp 98.8°F | Resp 12 | Ht 65.0 in | Wt 154.0 lb

## 2022-03-14 DIAGNOSIS — J069 Acute upper respiratory infection, unspecified: Secondary | ICD-10-CM | POA: Diagnosis not present

## 2022-03-14 DIAGNOSIS — J029 Acute pharyngitis, unspecified: Secondary | ICD-10-CM

## 2022-03-14 DIAGNOSIS — R051 Acute cough: Secondary | ICD-10-CM | POA: Diagnosis not present

## 2022-03-14 LAB — POCT RAPID STREP A (OFFICE): Rapid Strep A Screen: NEGATIVE

## 2022-03-14 LAB — POC COVID19 BINAXNOW: SARS Coronavirus 2 Ag: NEGATIVE

## 2022-03-14 LAB — POCT INFLUENZA A/B
Influenza A, POC: NEGATIVE
Influenza B, POC: NEGATIVE

## 2022-03-14 NOTE — Patient Instructions (Addendum)
A few things to remember from today's visit:   Acute cough - Plan: POC COVID-19 BinaxNow, POCT Influenza A/B  URI, acute - Plan: POCT Influenza A/B  Sore throat - Plan: POC COVID-19 BinaxNow, POCT rapid strep A  Do not use My Chart to request refills or for acute issues that need immediate attention. If you send a my chart message, it may take a few days to be addressed, specially if I am not in the office.  Please be sure medication list is accurate. If a new problem present, please set up appointment sooner than planned today. Viral Respiratory Infection A viral respiratory infection is an illness that affects parts of the body that are used for breathing. These include the lungs, nose, and throat. It is caused by a germ called a virus. Some examples of this kind of infection are: A cold. The flu (influenza). A respiratory syncytial virus (RSV) infection. What are the causes? This condition is caused by a virus. It spreads from person to person. You can get the virus if: You breathe in droplets from someone who is sick. You come in contact with people who are sick. You touch mucus or other fluid from a person who is sick. What are the signs or symptoms? Symptoms of this condition include: A stuffy or runny nose. A sore throat. A cough. Shortness of breath. Trouble breathing. Yellow or green fluid in the nose. Other symptoms may include: A fever. Sweating or chills. Tiredness (fatigue). Achy muscles. A headache. How is this treated? This condition may be treated with: Medicines that treat viruses. Medicines that make it easy to breathe. Medicines that are sprayed into the nose. Acetaminophen or NSAIDs, such as ibuprofen, to treat fever. Follow these instructions at home: Managing pain and congestion Take over-the-counter and prescription medicines only as told by your doctor. If you have a sore throat, gargle with salt water. Do this 3-4 times a day or as needed. To  make salt water, dissolve -1 tsp (3-6 g) of salt in 1 cup (237 mL) of warm water. Make sure that all the salt dissolves. Use nose drops made from salt water. This helps with stuffiness (congestion). It also helps soften the skin around your nose. Take 2 tsp (10 mL) of honey at bedtime to lessen coughing at night. Do not give honey to children who are younger than 50 year old. Drink enough fluid to keep your pee (urine) pale yellow. General instructions  Rest as much as possible. Do not drink alcohol. Do not smoke or use any products that contain nicotine or tobacco. If you need help quitting, ask your doctor. Keep all follow-up visits. How is this prevented?     Get a flu shot every year. Ask your doctor when you should get your flu shot. Do not let other people get your germs. If you are sick: Wash your hands with soap and water often. Wash your hands after you cough or sneeze. Wash hands for at least 20 seconds. If you cannot use soap and water, use hand sanitizer. Cover your mouth when you cough. Cover your nose and mouth when you sneeze. Do not share cups or eating utensils. Clean commonly used objects often. Clean commonly touched surfaces. Stay home from work or school. Avoid contact with people who are sick during cold and flu season. This is in fall and winter. Get help if: Your symptoms last for 10 days or longer. Your symptoms get worse over time. You have very bad  pain in your face or forehead. Parts of your jaw or neck get very swollen. You have shortness of breath. Get help right away if: You feel pain or pressure in your chest. You have trouble breathing. You faint or feel like you will faint. You keep vomiting and it gets worse. You feel confused. These symptoms may be an emergency. Get help right away. Call your local emergency services (911 in the [place] Do not wait to see if the symptoms will go away. Do not drive yourself to the hospital. Summary A viral  respiratory infection is an illness that affects parts of the body that are used for breathing. Examples of this illness include a cold, the flu, and a respiratory syncytial virus (RSV) infection. The infection can cause a runny nose, cough, sore throat, and fever. Follow what your doctor tells you about taking medicines, drinking lots of fluid, washing your hands, resting at home, and avoiding people who are sick. This information is not intended to replace advice given to you by your health care provider. Make sure you discuss any questions you have with your health care provider. Document Revised: 09/01/2020 Document Reviewed: 09/01/2020 Elsevier Patient Education  [place].

## 2022-03-14 NOTE — Progress Notes (Addendum)
ACUTE VISIT Chief Complaint  Patient presents with   Nasal Congestion   Headache   Generalized Body Aches   Fatigue   HPI: Ms.Zanai Iglesia is a 46 y.o. female, who is here today complaining of 3 days of respiratory symptoms as described above. Symptoms started a day after coming back from [place].  URI  This is a new problem. The current episode started in the past 7 days. The problem has been unchanged. There has been no fever. Associated symptoms include congestion, coughing, diarrhea, headaches, a plugged ear sensation, rhinorrhea and a sore throat. Pertinent negatives include no abdominal pain, chest pain, dysuria, ear pain, joint swelling, nausea, neck pain, rash, sneezing, swollen glands, vomiting or wheezing. She has tried NSAIDs for the symptoms. The treatment provided mild relief.  Mild nonproductive cough. Frontal pressure headache. "Little" SOB, feeling like she could not breathe through her nose.  No known sick contact. She has been taking ibuprofen.  She has also had diarrhea, which is not unusual for her to have when she travels overseas. Negative for blood or mucus in the stool.  Review of Systems  HENT:  Positive for congestion, postnasal drip, rhinorrhea, sinus pressure and sore throat. Negative for ear discharge, ear pain, mouth sores, nosebleeds and sneezing.   Respiratory:  Positive for cough. Negative for wheezing.   Cardiovascular:  Negative for chest pain.  Gastrointestinal:  Positive for diarrhea. Negative for abdominal pain, nausea and vomiting.  Genitourinary:  Negative for dysuria.  Musculoskeletal:  Negative for neck pain.  Skin:  Negative for rash.  Allergic/Immunologic: Positive for environmental allergies.  Neurological:  Positive for headaches. Negative for syncope, facial asymmetry and weakness.  Hematological:  Negative for adenopathy. Does not bruise/bleed easily.  Rest see pertinent positives and negatives per HPI.  Current Outpatient  Medications on File Prior to Visit  Medication Sig Dispense Refill   albuterol (VENTOLIN HFA) 108 (90 Base) MCG/ACT inhaler INHALE 2 INHALATIONS BY MOUTH EVERY 4 TO 6 HOURS AS NEEDED 18 g 1   diphenhydrAMINE (BENADRYL) 25 MG tablet Take 1 tablet (25 mg total) by mouth every 6 (six) hours as needed for itching or allergies. 20 tablet 0   Fexofenadine HCl (ALLEGRA ALLERGY PO) Take 1 tablet by mouth daily.     fluticasone (FLOVENT HFA) 110 MCG/ACT inhaler INHALE 2 PUFFS INTO THE LUNGS TWICE DAILY 12 g 5   Ibuprofen (MOTRIN IB PO) Take 200 mg by mouth as needed (pain).      Multiple Vitamin (MULTIVITAMIN ADULT PO) Take 1 tablet by mouth daily.     RYALTRIS [id number] MCG/ACT SUSP Place 2 sprays into the nose 2 (two) times daily. 29 g 5   venlafaxine XR (EFFEXOR-XR) 37.5 MG 24 hr capsule Take 37.5 mg by mouth daily.     Current Facility-Administered Medications on File Prior to Visit  Medication Dose Route Frequency Provider Last Rate Last Admin   0.9 %  sodium chloride infusion  500 mL Intravenous Continuous Pyrtle, Lajuan Lines, MD       Past Medical History:  Diagnosis Date   Anemia    Anxiety    DM (diabetes mellitus), gestational    Migraine headache    Urticaria    Allergies  Allergen Reactions   Azithromycin Anaphylaxis and Hives   Erythromycin     REACTION: sensitive stomach   Social History   Socioeconomic History   Marital status: Married    Spouse name: Not on file   Number of children: 3  Years of education: Not on file   Highest education level: Not on file  Occupational History   Occupation: Attorney  Tobacco Use   Smoking status: Former    Packs/day: 1.00    Years: 10.00    Total pack years: 10.00    Types: Cigarettes   Smokeless tobacco: Never  Vaping Use   Vaping Use: Never used  Substance and Sexual Activity   Alcohol use: Yes    Comment: 7 drinks per week per pt   Drug use: No   Sexual activity: Yes    Birth control/protection: I.U.D.  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  Physical Exam Vitals and nursing note reviewed.  Constitutional:      General: She is not in acute distress.    Appearance: She is well-developed. She is not ill-appearing.  HENT:     Head: Normocephalic and atraumatic.     Right Ear: Tympanic membrane, ear canal and external ear normal.     Left Ear: Tympanic membrane, ear canal and external ear normal.     Nose: Congestion and rhinorrhea present.     Right Turbinates: Enlarged.     Left Turbinates: Enlarged.     Right Sinus: No maxillary sinus tenderness or frontal sinus tenderness.     Left Sinus: No maxillary sinus tenderness or frontal sinus tenderness.     Mouth/Throat:     Mouth: Mucous membranes are moist.     Pharynx: Oropharynx is clear. Uvula midline. No oropharyngeal exudate or posterior oropharyngeal erythema.  Eyes:     Conjunctiva/sclera: Conjunctivae normal.  Cardiovascular:     Rate and Rhythm: Normal rate and regular rhythm.     Heart sounds: No murmur heard. Pulmonary:     Effort: Pulmonary effort is normal. No respiratory distress.     Breath sounds: Normal breath sounds. No stridor.  Abdominal:     Palpations: Abdomen is soft. There is no mass.     Tenderness: There is no abdominal tenderness.  Musculoskeletal:     Cervical back: No edema or erythema. No muscular tenderness.     Right lower leg: No edema.     Left lower leg: No edema.  Lymphadenopathy:     Head:     Right side of head: No submandibular adenopathy.     Left  side of head: No submandibular adenopathy.     Cervical: No cervical adenopathy.  Skin:    General: Skin is warm.     Findings: No erythema or rash.  Neurological:     Mental Status: She is alert and oriented to person, place, and time.  Psychiatric:     Comments: Well groomed, good eye contact.  ASSESSMENT AND PLAN:  Ms.Delora was seen today for nasal congestion, headache, generalized body aches and fatigue.  Diagnoses and all orders for this visit: Orders Placed This Encounter  Procedures   POC COVID-19 BinaxNow  POCT Influenza A/B   POCT rapid strep A   URI, acute Symptoms suggests a viral etiology, COVID-19 and rapid flu done in the office negative. Recommend symptomatic treatment for now. Instructed to monitor for signs of complications, including new onset of fever among some, clearly instructed about warning signs. I also explained that cough and nasal congestion can last a few days and sometimes weeks. Nasal saline irrigations will also help. F/U as needed.  Acute cough Explained that cough can become more productive. I do not think imaging is needed at this time. She does not feel like she needs something for cough at this time.  Sore throat Rapid strep negative. History and examination today do not suggest strep infection. Throat lozenges may help.  Return if symptoms worsen or fail to improve.  Pattye Meda G. Martin[place], MD  [facility]. [place] office.

## 2022-03-18 ENCOUNTER — Encounter: Payer: Self-pay | Admitting: Family Medicine

## 2022-04-05 DIAGNOSIS — Z01419 Encounter for gynecological examination (general) (routine) without abnormal findings: Secondary | ICD-10-CM | POA: Diagnosis not present

## 2022-04-05 DIAGNOSIS — Z1231 Encounter for screening mammogram for malignant neoplasm of breast: Secondary | ICD-10-CM | POA: Diagnosis not present

## 2022-04-05 DIAGNOSIS — Z6826 Body mass index (BMI) 26.0-26.9, adult: Secondary | ICD-10-CM | POA: Diagnosis not present

## 2022-04-11 DIAGNOSIS — N83209 Unspecified ovarian cyst, unspecified side: Secondary | ICD-10-CM | POA: Diagnosis not present

## 2022-04-11 DIAGNOSIS — R1900 Intra-abdominal and pelvic swelling, mass and lump, unspecified site: Secondary | ICD-10-CM | POA: Diagnosis not present

## 2022-04-11 DIAGNOSIS — D259 Leiomyoma of uterus, unspecified: Secondary | ICD-10-CM | POA: Diagnosis not present

## 2022-06-26 ENCOUNTER — Encounter: Payer: Self-pay | Admitting: Internal Medicine

## 2022-12-24 ENCOUNTER — Ambulatory Visit (INDEPENDENT_AMBULATORY_CARE_PROVIDER_SITE_OTHER): Payer: Federal, State, Local not specified - PPO

## 2022-12-24 ENCOUNTER — Other Ambulatory Visit: Payer: Self-pay

## 2022-12-24 ENCOUNTER — Ambulatory Visit: Payer: Federal, State, Local not specified - PPO | Admitting: Family Medicine

## 2022-12-24 VITALS — BP 124/84 | HR 64 | Ht 65.0 in | Wt 160.0 lb

## 2022-12-24 DIAGNOSIS — M25531 Pain in right wrist: Secondary | ICD-10-CM

## 2022-12-24 NOTE — Patient Instructions (Signed)
 Thank you for coming in today.    

## 2022-12-24 NOTE — Progress Notes (Unsigned)
   Rubin Payor, PhD, LAT, ATC acting as a scribe for Clementeen Graham, MD.  Mary Berg is a 47 y.o. female who presents to [facility] today for R wrist pain x 3 months. She fell on her R wrist playing pickle ball x2, but is unsure if that is related. Pt locates pain to the ulnar aspect of the R wrist, around the hook of the hamate. She works as a Clinical research associate.   Radiates: yes- when using it Paresthesia: no Grip strength: no Aggravates: hand writing, bearing weight through R hand ie downward dog, planks,  repetitive gripping Treatments tried: IBU, modify activity  Pertinent review of systems: ***  Relevant historical information: ***   Exam:  There were no vitals taken for this visit. General: Well Developed, well nourished, and in no acute distress.   MSK: ***    Lab and Radiology Results No results found for this or any previous visit (from the past 72 hour(s)). No results found.     Assessment and Plan: 47 y.o. female with ***   PDMP not reviewed this encounter. No orders of the defined types were placed in this encounter.  No orders of the defined types were placed in this encounter.    Discussed warning signs or symptoms. Please see discharge instructions. Patient expresses understanding.   ***

## 2022-12-31 NOTE — Progress Notes (Signed)
Right wrist x-ray looks normal to radiology

## 2023-04-08 DIAGNOSIS — Z1329 Encounter for screening for other suspected endocrine disorder: Secondary | ICD-10-CM | POA: Diagnosis not present

## 2023-04-08 DIAGNOSIS — Z124 Encounter for screening for malignant neoplasm of cervix: Secondary | ICD-10-CM | POA: Diagnosis not present

## 2023-04-08 DIAGNOSIS — Z131 Encounter for screening for diabetes mellitus: Secondary | ICD-10-CM | POA: Diagnosis not present

## 2023-04-08 DIAGNOSIS — Z0000 Encounter for general adult medical examination without abnormal findings: Secondary | ICD-10-CM | POA: Diagnosis not present

## 2023-04-08 DIAGNOSIS — Z13220 Encounter for screening for lipoid disorders: Secondary | ICD-10-CM | POA: Diagnosis not present

## 2023-04-08 DIAGNOSIS — Z01419 Encounter for gynecological examination (general) (routine) without abnormal findings: Secondary | ICD-10-CM | POA: Diagnosis not present

## 2023-04-08 DIAGNOSIS — Z1331 Encounter for screening for depression: Secondary | ICD-10-CM | POA: Diagnosis not present

## 2023-05-29 ENCOUNTER — Ambulatory Visit (AMBULATORY_SURGERY_CENTER): Payer: Federal, State, Local not specified - PPO | Admitting: *Deleted

## 2023-05-29 VITALS — Ht 65.0 in | Wt 160.0 lb

## 2023-05-29 DIAGNOSIS — Z860100 Personal history of colon polyps, unspecified: Secondary | ICD-10-CM

## 2023-05-29 MED ORDER — NA SULFATE-K SULFATE-MG SULF 17.5-3.13-1.6 GM/177ML PO SOLN: 1.0000 | Freq: Once | ORAL | 0 refills | Status: AC

## 2023-05-29 NOTE — Progress Notes (Signed)
Pt's name and DOB verified at the beginning of the pre-visit wit 2 identifiers  Pt denies any difficulty with ambulating,sitting, laying down or rolling side to side  Pt has no issues with ambulation   Pt has no issues moving head neck or swallowing  No egg or soy allergy known to patient   No issues known to pt with past sedation with any surgeries or procedures  Pt denies having issues being intubated  No FH of Malignant Hyperthermia  Pt is not on diet pills or shots  Pt is not on home 02   Pt is not on blood thinners   Pt denies issues with constipation   Pt is not on dialysis  Pt denise any abnormal heart rhythms   Pt denies any upcoming cardiac testing  Pt encouraged to use to use Singlecare or Goodrx to reduce cost   Patient's chart reviewed by Cathlyn Parsons CNRA prior to pre-visit and patient appropriate for the [facility].  Pre-visit completed and red dot placed by patient's name on their procedure day (on provider's schedule).  .  Visit by phone  Pt states weight is 160 lb  Instructed pt why it is important to and  to call if they have any changes in health or new medications. Directed them to the # given and on instructions.     Instructions reviewed. Pt given both [facility] main # and MD on call # prior to instructions.  Pt states understanding. Instructed to review again prior to procedure. Pt states they will.   Instructions sent by mail with coupon and by My Chart  Coupon sent via text to mobile phone and pt verified they received it

## 2023-06-27 ENCOUNTER — Ambulatory Visit (AMBULATORY_SURGERY_CENTER): Payer: Federal, State, Local not specified - PPO | Admitting: Internal Medicine

## 2023-06-27 ENCOUNTER — Encounter: Payer: Self-pay | Admitting: Internal Medicine

## 2023-06-27 VITALS — BP 89/51 | HR 69 | Temp 98.1°F | Resp 18 | Ht 65.0 in | Wt 160.0 lb

## 2023-06-27 DIAGNOSIS — D123 Benign neoplasm of transverse colon: Secondary | ICD-10-CM | POA: Diagnosis not present

## 2023-06-27 DIAGNOSIS — K5730 Diverticulosis of large intestine without perforation or abscess without bleeding: Secondary | ICD-10-CM

## 2023-06-27 DIAGNOSIS — D122 Benign neoplasm of ascending colon: Secondary | ICD-10-CM

## 2023-06-27 DIAGNOSIS — K648 Other hemorrhoids: Secondary | ICD-10-CM | POA: Diagnosis not present

## 2023-06-27 DIAGNOSIS — Z860100 Personal history of colon polyps, unspecified: Secondary | ICD-10-CM

## 2023-06-27 DIAGNOSIS — Z1211 Encounter for screening for malignant neoplasm of colon: Secondary | ICD-10-CM

## 2023-06-27 MED ORDER — SODIUM CHLORIDE 0.9 % IV SOLN: 500.0000 mL | INTRAVENOUS | Status: DC

## 2023-06-27 NOTE — Progress Notes (Signed)
Pt A/O x 3, gd SR's, pleased with anesthesia, report to RNPt A/O x 3, gd SR's, pleased with anesthesia, report to RN

## 2023-06-27 NOTE — Progress Notes (Signed)
GASTROENTEROLOGY PROCEDURE H&P NOTE   Primary Care Physician: [place], Betty G, MD    Reason for Procedure:   Hx of polyp  Plan:    colonoscopy  Patient is appropriate for endoscopic procedure(s) in the ambulatory ([facility]) setting.  The nature of the procedure, as well as the risks, benefits, and alternatives were carefully and thoroughly reviewed with the patient. Ample time for discussion and questions allowed. The patient understood, was satisfied, and agreed to proceed.     HPI: Mary Berg is a 48 y.o. female who presents for colonoscopy.  Medical history as below.  Tolerated the prep.  No recent chest pain or shortness of breath.  No abdominal pain today.  Past Medical History:  Diagnosis Date   Anemia    Anxiety    DM (diabetes mellitus), gestational    Migraine headache    Substance abuse (HCC)    ETOH stopped 2024   Urticaria     Past Surgical History:  Procedure Laterality Date   CESAREAN SECTION     primary uncomplicated in 2007   COLONOSCOPY     TAB     history of one uncomplicated TAB in 1995   TONSILLECTOMY      Prior to Admission medications   Medication Sig Start Date End Date Taking? Authorizing Provider  loratadine (CLARITIN) 10 MG tablet Take 10 mg by mouth daily.   Yes [provider]  venlafaxine XR (EFFEXOR-XR) 37.5 MG 24 hr capsule Take 37.5 mg by mouth daily. 01/22/21  Yes [provider]  albuterol (VENTOLIN HFA) 108 (90 Base) MCG/ACT inhaler INHALE 2 INHALATIONS BY MOUTH EVERY 4 TO 6 HOURS AS NEEDED 06/14/21   Marcelyn Bruins, MD  diphenhydrAMINE (BENADRYL) 25 MG tablet Take 1 tablet (25 mg total) by mouth every 6 (six) hours as needed for itching or allergies. Patient not taking: Reported on 05/29/2023 05/26/15   Fayrene Helper, PA-C  fluticasone [facility] HFA) 110 MCG/ACT inhaler INHALE 2 PUFFS INTO THE LUNGS TWICE DAILY Patient not taking: Reported on 05/29/2023 03/07/22   Marcelyn Bruins, MD  Ibuprofen  (MOTRIN IB PO) Take 200 mg by mouth as needed (pain).     [provider]  Multiple Vitamin (MULTIVITAMIN ADULT PO) Take 1 tablet by mouth daily.    [provider]  RYALTRIS [phone] MCG/ACT SUSP Place 2 sprays into the nose 2 (two) times daily. Patient not taking: Reported on 06/27/2023 05/12/21   Marcelyn Bruins, MD    Current Outpatient Medications  Medication Sig Dispense Refill   loratadine (CLARITIN) 10 MG tablet Take 10 mg by mouth daily.     venlafaxine XR (EFFEXOR-XR) 37.5 MG 24 hr capsule Take 37.5 mg by mouth daily.     albuterol (VENTOLIN HFA) 108 (90 Base) MCG/ACT inhaler INHALE 2 INHALATIONS BY MOUTH EVERY 4 TO 6 HOURS AS NEEDED 18 g 1   diphenhydrAMINE (BENADRYL) 25 MG tablet Take 1 tablet (25 mg total) by mouth every 6 (six) hours as needed for itching or allergies. (Patient not taking: Reported on 05/29/2023) 20 tablet 0   fluticasone (FLOVENT HFA) 110 MCG/ACT inhaler INHALE 2 PUFFS INTO THE LUNGS TWICE DAILY (Patient not taking: Reported on 05/29/2023) 12 g 5   Ibuprofen (MOTRIN IB PO) Take 200 mg by mouth as needed (pain).      Multiple Vitamin (MULTIVITAMIN ADULT PO) Take 1 tablet by mouth daily.     RYALTRIS [id number] MCG/ACT SUSP Place 2 sprays into the nose 2 (two) times daily. (  Patient not taking: Reported on 06/27/2023) 29 g 5   Current Facility-Administered Medications  Medication Dose Route Frequency Provider Last Rate Last Admin   0.9 %  sodium chloride infusion  500 mL Intravenous Continuous Rayann Jolley, Carie Caddy, MD       0.9 %  sodium chloride infusion  500 mL Intravenous Continuous Masiyah Jorstad, Carie Caddy, MD        Allergies as of 06/27/2023 - Review Complete 06/27/2023  Allergen Reaction Noted   Azithromycin Anaphylaxis and Hives 05/25/2015   Erythromycin      Family History  Problem Relation Age of Onset   Colon polyps Mother    Breast cancer Paternal Aunt    Diabetes Paternal Aunt    Vaginal cancer Paternal Aunt    Skin cancer Maternal  Grandmother        Squamous cell   Heart disease Paternal Grandfather    Nephrolithiasis Other    Arthritis Other    Hypertension Other    Coronary artery disease Other        no premature in immediate family   Colon cancer Neg Hx    Esophageal cancer Neg Hx    Rectal cancer Neg Hx    Stomach cancer Neg Hx     Social History   Socioeconomic History   Marital status: Married    Spouse name: Not on file   Number of children: 3   Years of education: Not on file   Highest education level: Not on file  Occupational History   Occupation: Attorney  Tobacco Use   Smoking status: Former    Current packs/day: 1.00    Average packs/day: 1 pack/day for 10.0 years (10.0 ttl pk-yrs)    Types: Cigarettes   Smokeless tobacco: Never  Vaping Use   Vaping status: Never Used  Substance and Sexual Activity   Alcohol use: Not Currently    Comment: stopped 07/01/22   Drug use: No   Sexual activity: Yes    Birth control/protection: I.U.D.  Other Topics Concern   Not on file  Social History Narrative   Daily caffeine    Works park time as Investment banker, corporate on a regular basis   Social Drivers of Corporate investment banker Strain: Not on file  Food Insecurity: Not on file  Transportation Needs: Not on file  Physical Activity: Not on file  Stress: Not on file  Social Connections: Not on file  Intimate Partner Violence: Not on file    Physical Exam: Vital signs in last 24 hours: @BP  (!) 142/88   Pulse 89   Temp 98.1 F (36.7 C)   Ht 5\' 5"  (1.651 m)   Wt 160 lb (72.6 kg)   SpO2 99%   BMI 26.63 kg/m  GEN: NAD EYE: Sclerae anicteric ENT: MMM CV: Non-tachycardic Pulm: CTA b/l GI: Soft, NT/ND NEURO:  Alert & Oriented x 3   Erick Blinks, MD [facility]  06/27/2023 8:03 AM

## 2023-06-27 NOTE — Patient Instructions (Signed)
Thank you for letting us take care of your healthcare needs today. Please see handouts given to you on Polyps, Diverticulosis and Hemorrhoids.     YOU HAD AN ENDOSCOPIC PROCEDURE TODAY AT THE [facility]:   Refer to the procedure report that was given to you for any specific questions about what was found during the examination.  If the procedure report does not answer your questions, please call your gastroenterologist to clarify.  If you requested that your care partner not be given the details of your procedure findings, then the procedure report has been included in a sealed envelope for you to review at your convenience later.  YOU SHOULD EXPECT: Some feelings of bloating in the abdomen. Passage of more gas than usual.  Walking can help get rid of the air that was put into your GI tract during the procedure and reduce the bloating. If you had a lower endoscopy (such as a colonoscopy or flexible sigmoidoscopy) you may notice spotting of blood in your stool or on the toilet paper. If you underwent a bowel prep for your procedure, you may not have a normal bowel movement for a few days.  Please Note:  You might notice some irritation and congestion in your nose or some drainage.  This is from the oxygen used during your procedure.  There is no need for concern and it should clear up in a day or so.  SYMPTOMS TO REPORT IMMEDIATELY:  Following lower endoscopy (colonoscopy or flexible sigmoidoscopy):  Excessive amounts of blood in the stool  Significant tenderness or worsening of abdominal pains  Swelling of the abdomen that is new, acute  Fever of 100F or higher   For urgent or emergent issues, a gastroenterologist can be reached at any hour by calling [phone]. Do not use MyChart messaging for urgent concerns.    DIET:  We do recommend a small meal at first, but then you may proceed to your regular diet.  Drink plenty of fluids but you should avoid alcoholic beverages  for 24 hours.  ACTIVITY:  You should plan to take it easy for the rest of today and you should NOT DRIVE or use heavy machinery until tomorrow (because of the sedation medicines used during the test).    FOLLOW UP: Our staff will call the number listed on your records the next business day following your procedure.  We will call around 7:15- 8:00 am to check on you and address any questions or concerns that you may have regarding the information given to you following your procedure. If we do not reach you, we will leave a message.     If any biopsies were taken you will be contacted by phone or by letter within the next 1-3 weeks.  Please call us at [phone] if you have not heard about the biopsies in 3 weeks.    SIGNATURES/CONFIDENTIALITY: You and/or your care partner have signed paperwork which will be entered into your electronic medical record.  These signatures attest to the fact that that the information above on your After Visit Summary has been reviewed and is understood.  Full responsibility of the confidentiality of this discharge information lies with you and/or your care-partner. 

## 2023-06-27 NOTE — Op Note (Signed)
[facility] Patient Name: Mary Berg Procedure Date: 06/27/2023 8:03 AM MRN: [id number] Endoscopist: Beverley Fiedler , MD, [id number] Age: 48 Referring MD:  Date of Birth: 11/06/1975 Gender: Female Account #: [id number] Procedure:                Colonoscopy Indications:              High risk colon cancer surveillance: Personal                            history of non-advanced adenoma, Last colonoscopy:                            November 2018 (no polyps); Feb 2013 (TA x 1) Medicines:                Monitored Anesthesia Care Procedure:                Pre-Anesthesia Assessment:                           - Prior to the procedure, a History and Physical                            was performed, and patient medications and                            allergies were reviewed. The patient's tolerance of                            previous anesthesia was also reviewed. The risks                            and benefits of the procedure and the sedation                            options and risks were discussed with the patient.                            All questions were answered, and informed consent                            was obtained. Prior Anticoagulants: The patient has                            taken no anticoagulant or antiplatelet agents. ASA                            Grade Assessment: II - A patient with mild systemic                            disease. After reviewing the risks and benefits,                            the patient was deemed in satisfactory condition to  undergo the procedure.                           After obtaining informed consent, the colonoscope                            was passed under direct vision. Throughout the                            procedure, the patient's blood pressure, pulse, and                            oxygen saturations were monitored continuously. The                            [id number]-[id number]  Colonoscope [id number] was introduced                            through the anus and advanced to the cecum,                            identified by appendiceal orifice and ileocecal                            valve. The colonoscopy was performed without                            difficulty. The patient tolerated the procedure                            well. The quality of the bowel preparation was                            good. The ileocecal valve, appendiceal orifice, and                            rectum were photographed. Scope In: 8:08:43 AM Scope Out: 8:24:17 AM Scope Withdrawal Time: 0 hours 12 minutes 3 seconds  Total Procedure Duration: 0 hours 15 minutes 34 seconds  Findings:                 Skin tags were found on perianal exam.                           A 5 mm polyp was found in the ascending colon. The                            polyp was sessile. The polyp was removed with a                            cold snare. Resection and retrieval were complete.                           A 1 mm polyp was found in the transverse colon. The  polyp was sessile. The polyp was removed with a                            cold snare. Resection and retrieval were complete.                           Scattered small-mouthed diverticula were found in                            the sigmoid colon and descending colon.                           Internal hemorrhoids were found during                            retroflexion. The hemorrhoids were small. Complications:            No immediate complications. Estimated Blood Loss:     Estimated blood loss was minimal. Impression:               - One 5 mm polyp in the ascending colon, removed                            with a cold snare. Resected and retrieved.                           - One 1 mm polyp in the transverse colon, removed                            with a cold snare. Resected and retrieved.                            - Mild diverticulosis in the sigmoid colon and in                            the descending colon.                           - Small internal hemorrhoids with anal skin tag. Recommendation:           - Patient has a contact number available for                            emergencies. The signs and symptoms of potential                            delayed complications were discussed with the                            patient. Return to normal activities tomorrow.                            Written discharge instructions were provided to the  patient.                           - Resume previous diet.                           - Continue present medications.                           - Await pathology results.                           - Repeat colonoscopy is recommended for                            surveillance. The colonoscopy date will be                            determined after pathology results from today's                            exam become available for review. Beverley Fiedler, MD 06/27/2023 8:29:54 AM This report has been signed electronically.

## 2023-06-27 NOTE — Progress Notes (Signed)
Pt's states no medical or surgical changes since previsit or office visit. 

## 2023-06-27 NOTE — Progress Notes (Signed)
Called to room to assist during endoscopic procedure.  Patient ID and intended procedure confirmed with present staff. Received instructions for my participation in the procedure from the performing physician.  

## 2023-06-28 ENCOUNTER — Telehealth: Payer: Self-pay | Admitting: *Deleted

## 2023-06-28 NOTE — Telephone Encounter (Signed)
No answer on  follow up call. Left message.   

## 2023-07-01 LAB — SURGICAL PATHOLOGY

## 2023-07-05 ENCOUNTER — Encounter: Payer: Self-pay | Admitting: Internal Medicine

## 2023-11-06 DIAGNOSIS — N390 Urinary tract infection, site not specified: Secondary | ICD-10-CM | POA: Diagnosis not present

## 2023-11-06 DIAGNOSIS — A499 Bacterial infection, unspecified: Secondary | ICD-10-CM | POA: Diagnosis not present

## 2023-11-06 DIAGNOSIS — R030 Elevated blood-pressure reading, without diagnosis of hypertension: Secondary | ICD-10-CM | POA: Diagnosis not present

## 2023-11-22 DIAGNOSIS — R350 Frequency of micturition: Secondary | ICD-10-CM | POA: Diagnosis not present

## 2023-11-22 DIAGNOSIS — N3000 Acute cystitis without hematuria: Secondary | ICD-10-CM | POA: Diagnosis not present

## 2023-11-22 DIAGNOSIS — R300 Dysuria: Secondary | ICD-10-CM | POA: Diagnosis not present

## 2023-11-22 DIAGNOSIS — N390 Urinary tract infection, site not specified: Secondary | ICD-10-CM | POA: Diagnosis not present

## 2024-04-14 DIAGNOSIS — Z1331 Encounter for screening for depression: Secondary | ICD-10-CM | POA: Diagnosis not present

## 2024-04-14 DIAGNOSIS — Z01419 Encounter for gynecological examination (general) (routine) without abnormal findings: Secondary | ICD-10-CM | POA: Diagnosis not present

## 2024-04-14 DIAGNOSIS — Z13220 Encounter for screening for lipoid disorders: Secondary | ICD-10-CM | POA: Diagnosis not present

## 2024-04-14 DIAGNOSIS — Z1231 Encounter for screening mammogram for malignant neoplasm of breast: Secondary | ICD-10-CM | POA: Diagnosis not present

## 2024-04-14 DIAGNOSIS — Z0000 Encounter for general adult medical examination without abnormal findings: Secondary | ICD-10-CM | POA: Diagnosis not present

## 2024-04-14 DIAGNOSIS — Z1329 Encounter for screening for other suspected endocrine disorder: Secondary | ICD-10-CM | POA: Diagnosis not present
